# Patient Record
Sex: Female | Born: 1945 | Race: White | Hispanic: No | Smoking: Former smoker
Health system: Southern US, Academic
[De-identification: ages and names within clinical notes are randomized; demographics above are authoritative.]

## PROBLEM LIST (undated history)

## (undated) DIAGNOSIS — H269 Unspecified cataract: Secondary | ICD-10-CM

## (undated) DIAGNOSIS — I4891 Unspecified atrial fibrillation: Secondary | ICD-10-CM

## (undated) DIAGNOSIS — R600 Localized edema: Secondary | ICD-10-CM

## (undated) DIAGNOSIS — R202 Paresthesia of skin: Secondary | ICD-10-CM

## (undated) DIAGNOSIS — R001 Bradycardia, unspecified: Secondary | ICD-10-CM

## (undated) DIAGNOSIS — I2510 Atherosclerotic heart disease of native coronary artery without angina pectoris: Secondary | ICD-10-CM

## (undated) DIAGNOSIS — H9193 Unspecified hearing loss, bilateral: Secondary | ICD-10-CM

## (undated) DIAGNOSIS — I10 Essential (primary) hypertension: Secondary | ICD-10-CM

## (undated) DIAGNOSIS — N289 Disorder of kidney and ureter, unspecified: Secondary | ICD-10-CM

## (undated) DIAGNOSIS — C4492 Squamous cell carcinoma of skin, unspecified: Secondary | ICD-10-CM

## (undated) DIAGNOSIS — M25569 Pain in unspecified knee: Secondary | ICD-10-CM

## (undated) DIAGNOSIS — E785 Hyperlipidemia, unspecified: Secondary | ICD-10-CM

## (undated) DIAGNOSIS — I059 Rheumatic mitral valve disease, unspecified: Secondary | ICD-10-CM

## (undated) DIAGNOSIS — M8580 Other specified disorders of bone density and structure, unspecified site: Secondary | ICD-10-CM

## (undated) DIAGNOSIS — E559 Vitamin D deficiency, unspecified: Secondary | ICD-10-CM

## (undated) DIAGNOSIS — R1900 Intra-abdominal and pelvic swelling, mass and lump, unspecified site: Secondary | ICD-10-CM

## (undated) HISTORY — DX: Unspecified hearing loss, bilateral: H91.93

## (undated) HISTORY — DX: Localized edema: R60.0

## (undated) HISTORY — DX: Squamous cell carcinoma of skin, unspecified: C44.92

## (undated) HISTORY — PX: BREAST SURGERY: SHX581

## (undated) HISTORY — DX: Disorder of kidney and ureter, unspecified: N28.9

## (undated) HISTORY — DX: Intra-abdominal and pelvic swelling, mass and lump, unspecified site: R19.00

## (undated) HISTORY — PX: MOLE REMOVAL: SHX2046

## (undated) HISTORY — DX: Unspecified atrial fibrillation (CMS HCC): I48.91

## (undated) HISTORY — DX: Other specified disorders of bone density and structure, unspecified site: M85.80

## (undated) HISTORY — DX: Bradycardia, unspecified: R00.1

## (undated) HISTORY — DX: Essential (primary) hypertension: I10

## (undated) HISTORY — DX: Vitamin D deficiency, unspecified: E55.9

## (undated) HISTORY — PX: HX HYSTERECTOMY: SHX81

## (undated) HISTORY — PX: COLONOSCOPY: WVUENDOPRO10

## (undated) HISTORY — DX: Atherosclerotic heart disease of native coronary artery without angina pectoris: I25.10

## (undated) HISTORY — PX: HX BREAST BIOPSY: SHX20

## (undated) HISTORY — DX: Unspecified cataract: H26.9

## (undated) HISTORY — DX: Hyperlipidemia, unspecified: E78.5

## (undated) HISTORY — DX: Paresthesia of skin: R20.2

## (undated) HISTORY — DX: Rheumatic mitral valve disease, unspecified: I05.9

## (undated) HISTORY — DX: Pain in unspecified knee: M25.569

---

## 1992-04-27 ENCOUNTER — Other Ambulatory Visit (HOSPITAL_COMMUNITY): Payer: Self-pay

## 2008-03-02 IMAGING — US CV
1 series · 14 of 16 positions shown · non-contrast
Comparison: None available.

HISTORY: This 62-year-old female complains of syncopal episodes and facial numbness on the left side.  The patient is an ex-smoker.

[Series 1: cv · 0.06mm/px · 14 of 53 slices shown]
[im 1/53]
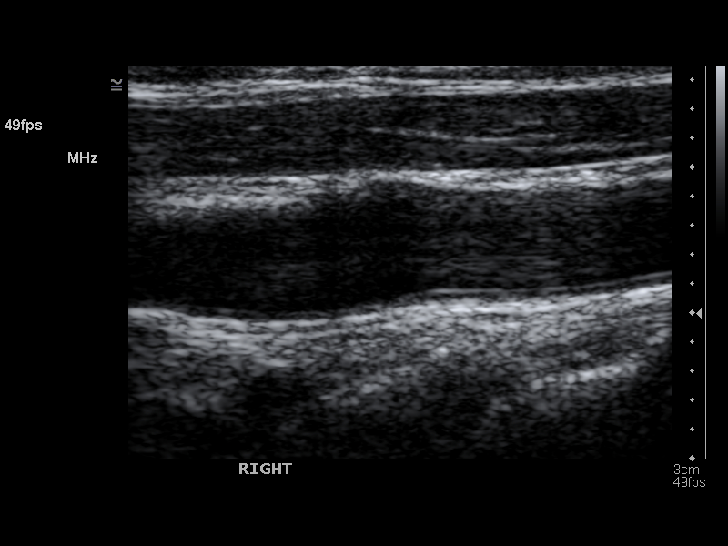
[im 4/53]
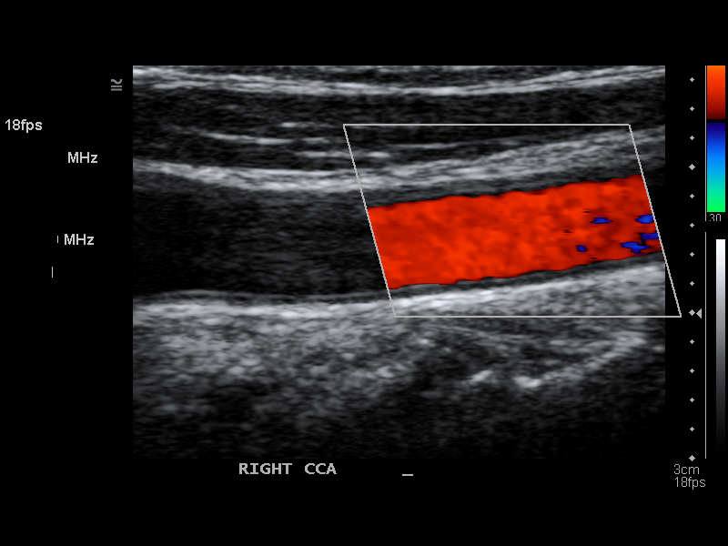
[im 7/53]
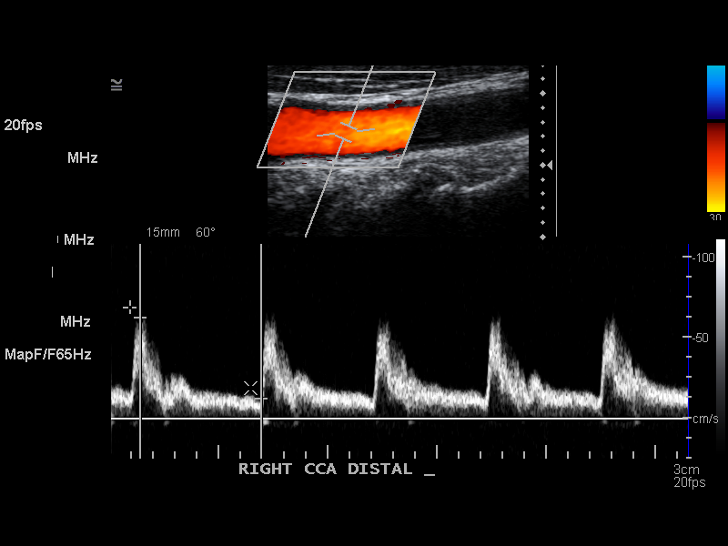
[im 14/53]
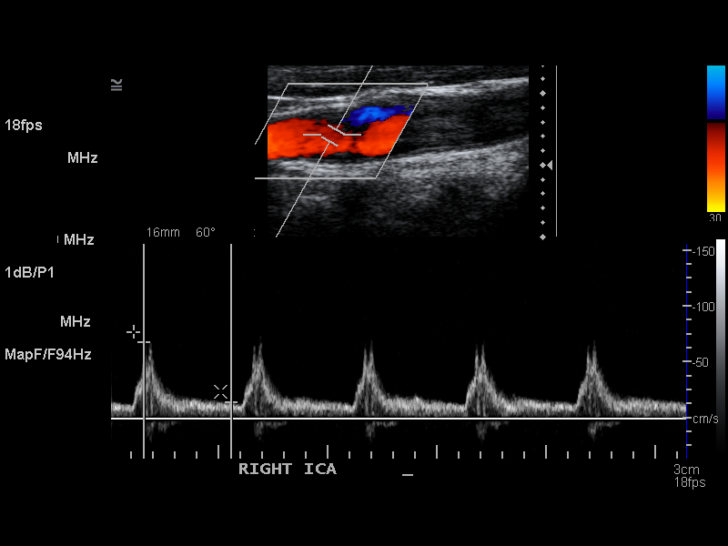
[im 18/53]
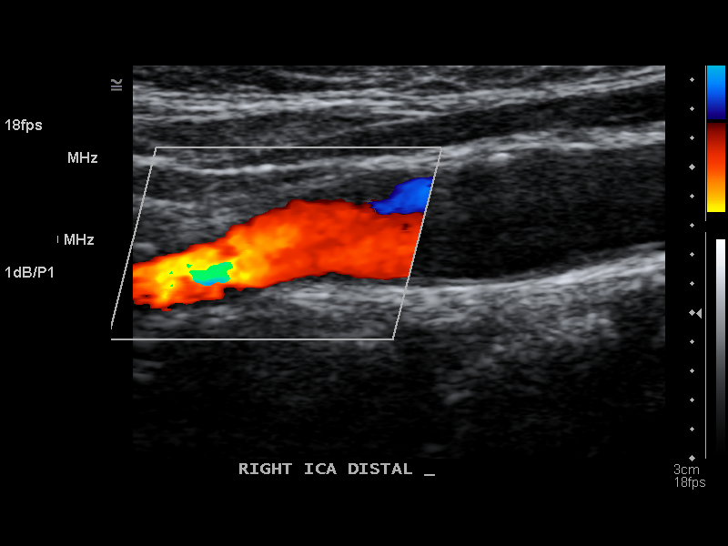
[im 21/53]
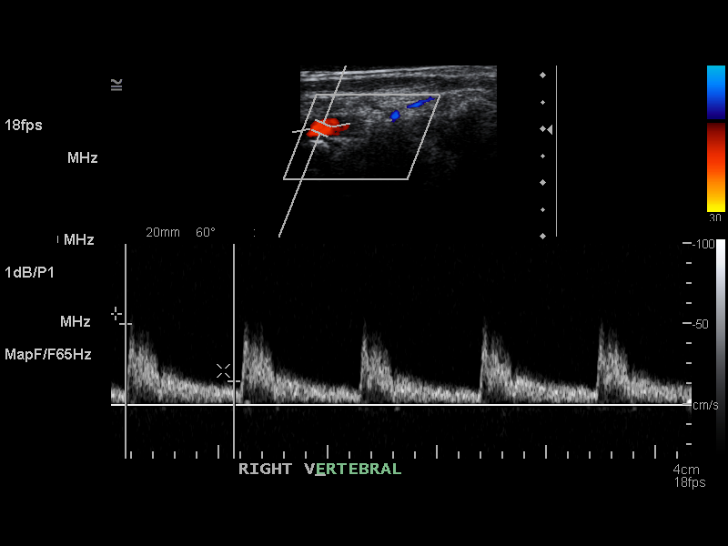
[im 25/53]
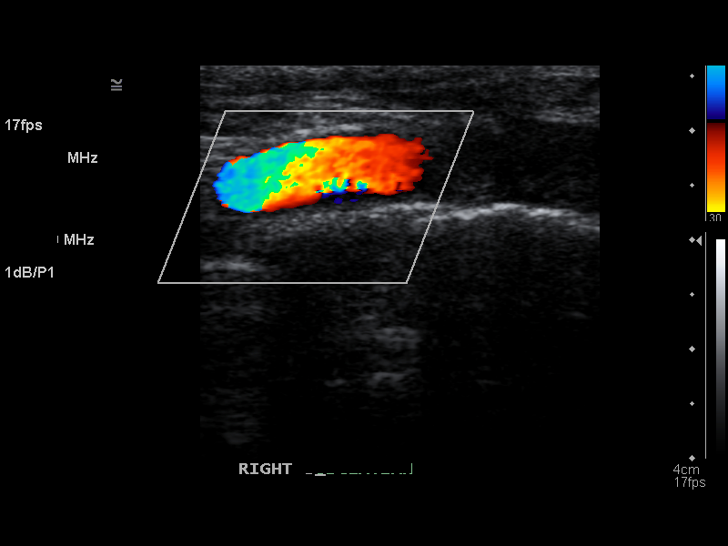
[im 28/53]
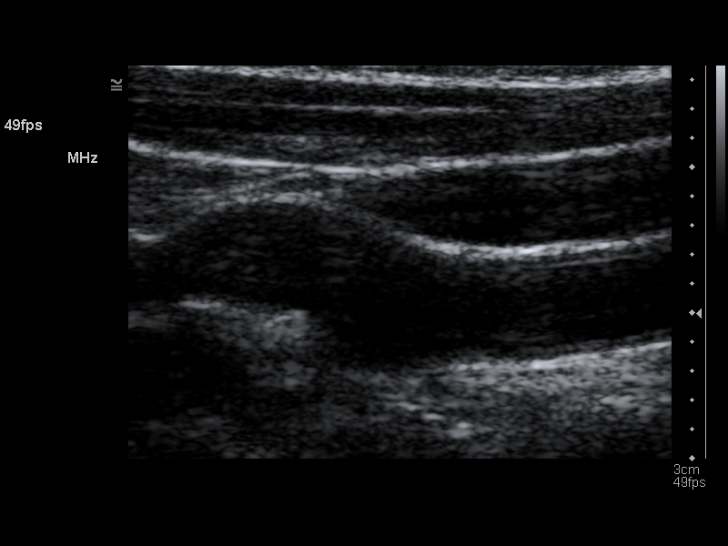
[im 32/53]
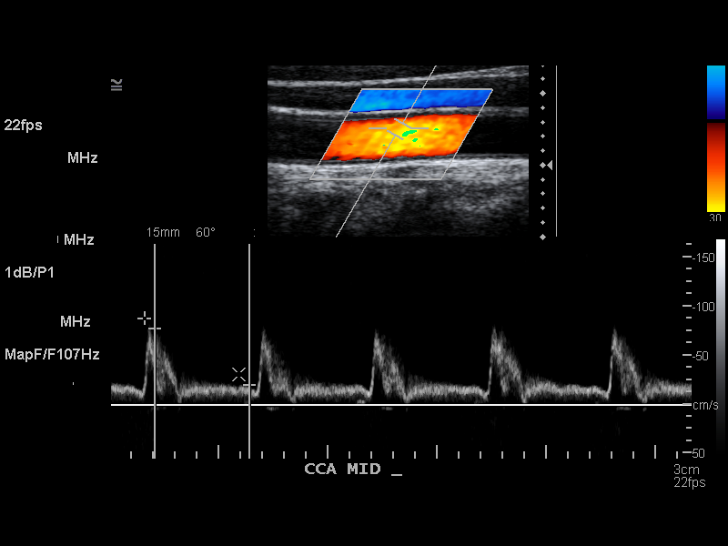
[im 35/53]
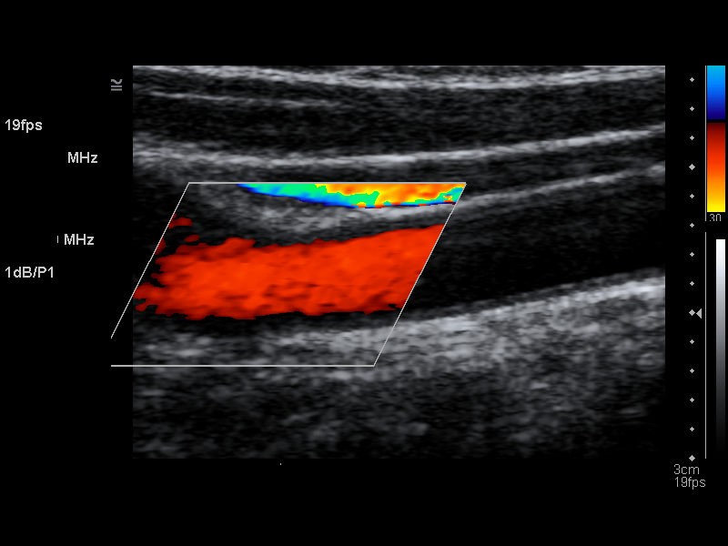
[im 42/53]
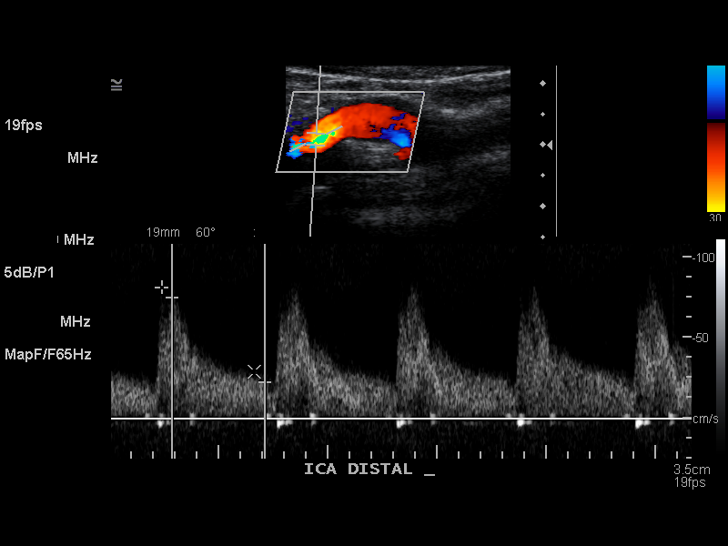
[im 46/53]
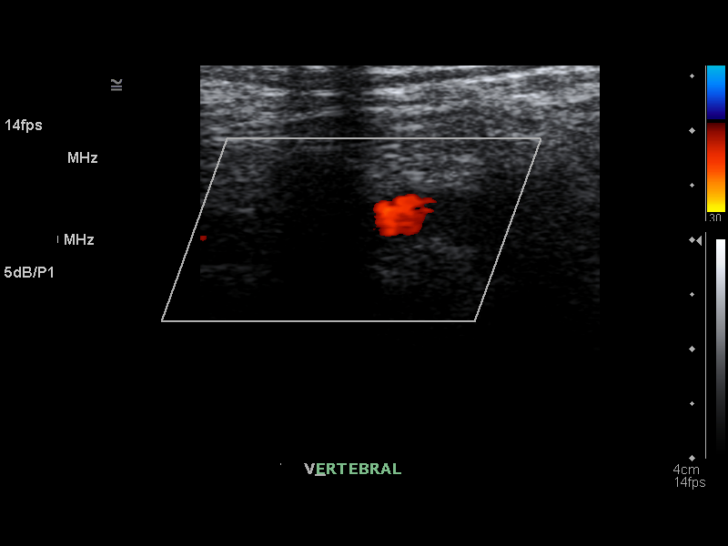
[im 49/53]
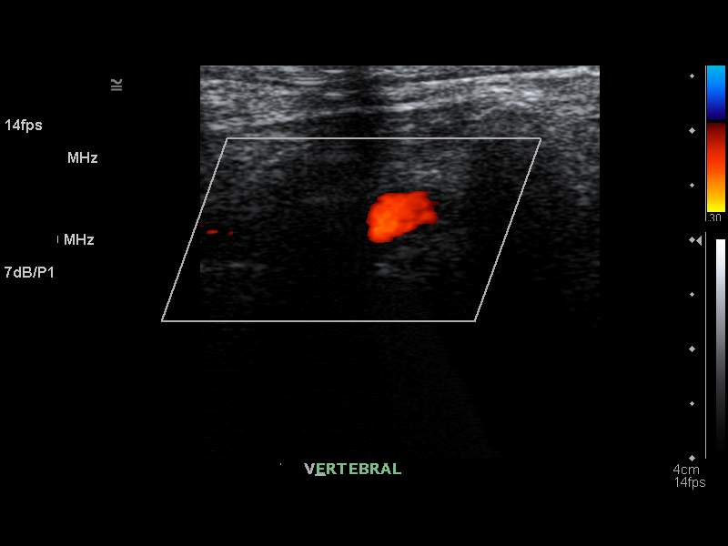
[im 53/53]
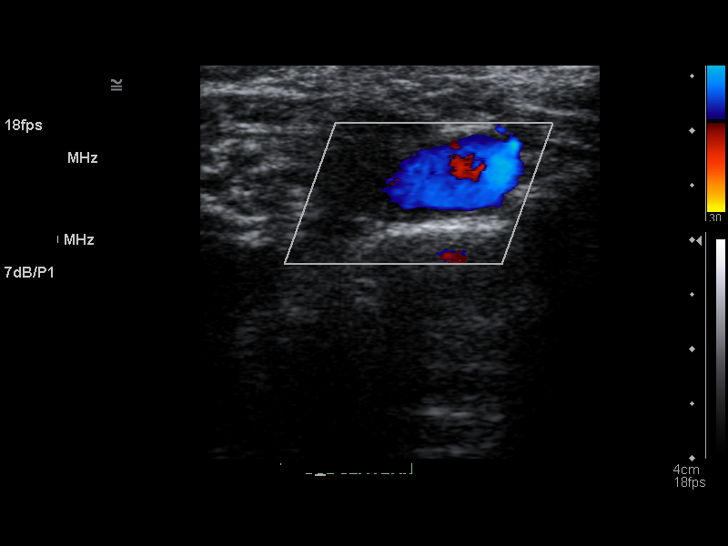

[14 of 16 positions shown; findings below may reference images not displayed]

FINDINGS: Antegrade flow is noted in both vertebral arteries.  No hemodynamically significant obstruction of the carotid arteries are seen on either side.
IMPRESSION: 1.  Normal duplex Doppler flow studies of vertebral and carotid arteries of the neck.  No evidence of hemodynamically significant obstruction is seen on either side.  

________________________________

## 2010-08-13 IMAGING — US ABDOMEN
1 series · 14 of 25 positions shown · non-contrast
Comparison: CT scan dated 02/08/10.

Bender, Carlos Afonso

Exam: 
Ultrasound Upper Abdomen Complete
HISTORY: Follow up hemangioma of the liver.

[Series 1: abdomen · 0.29mm/px · 14 of 63 slices shown]
[im 1/63]
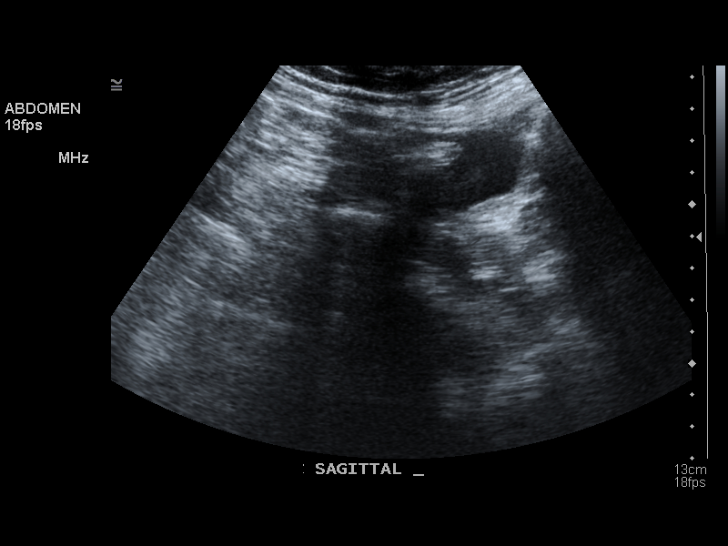
[im 6/63]
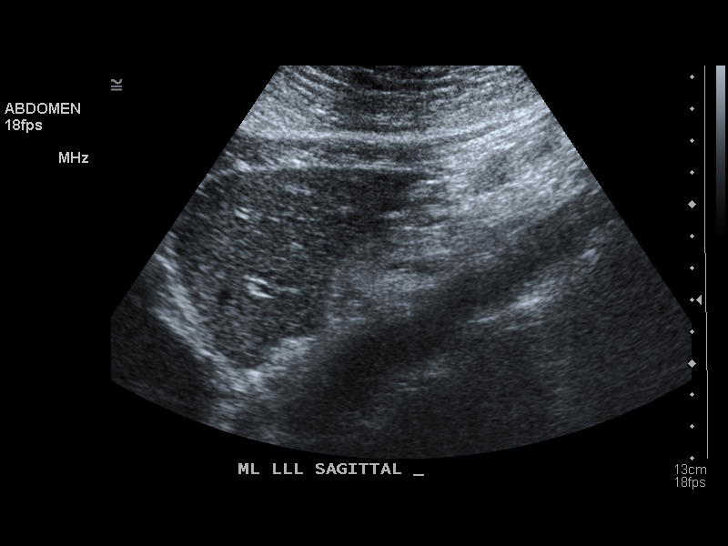
[im 11/63]
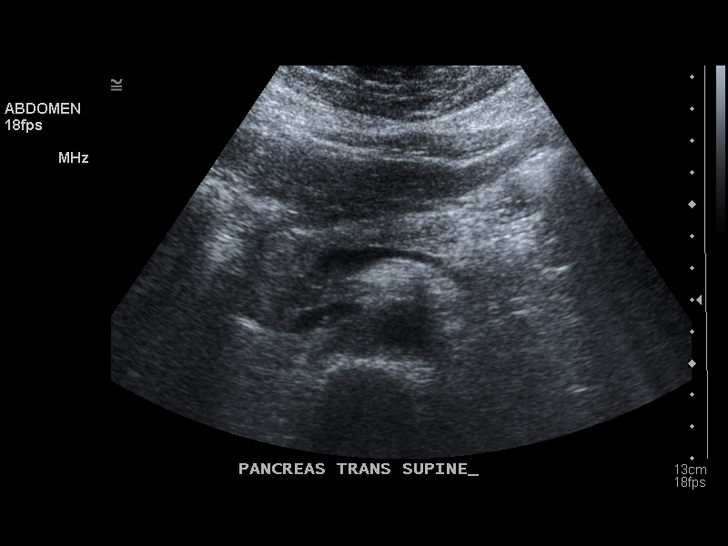
[im 16/63]
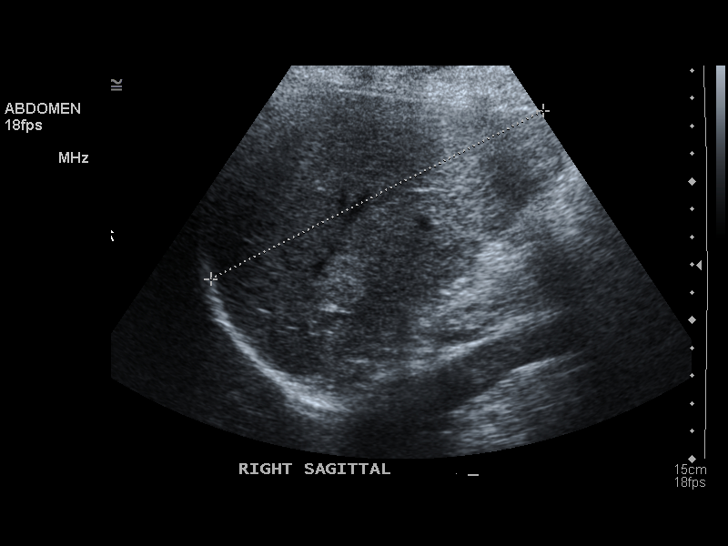
[im 21/63]
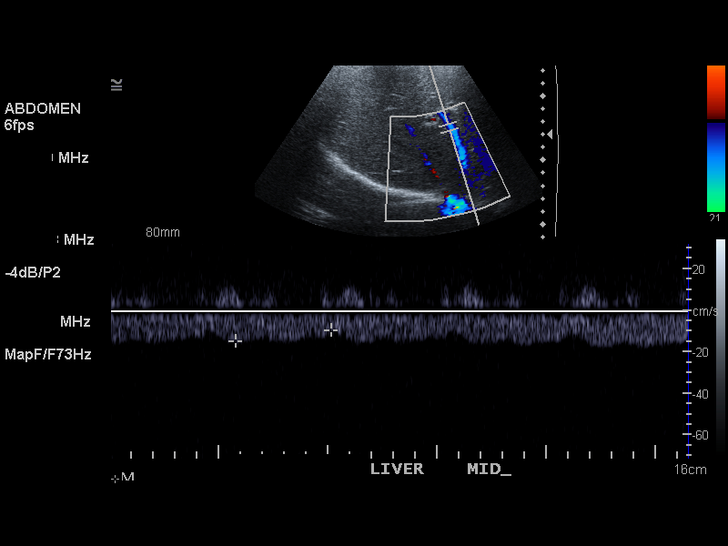
[im 24/63]
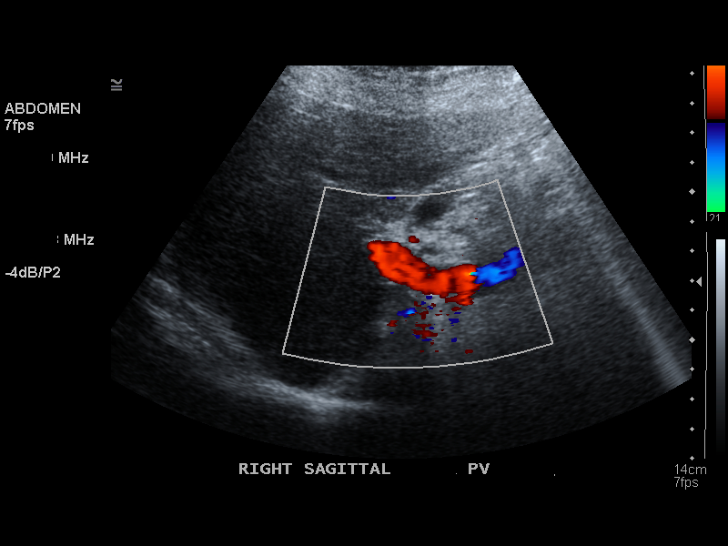
[im 29/63]
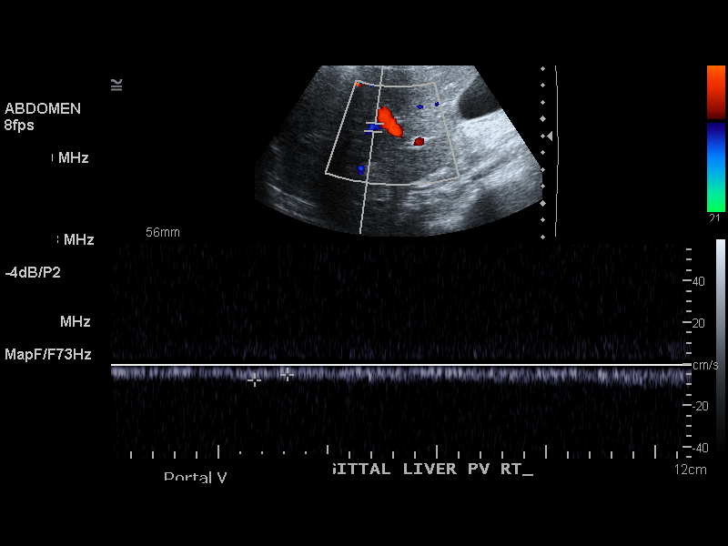
[im 34/63]
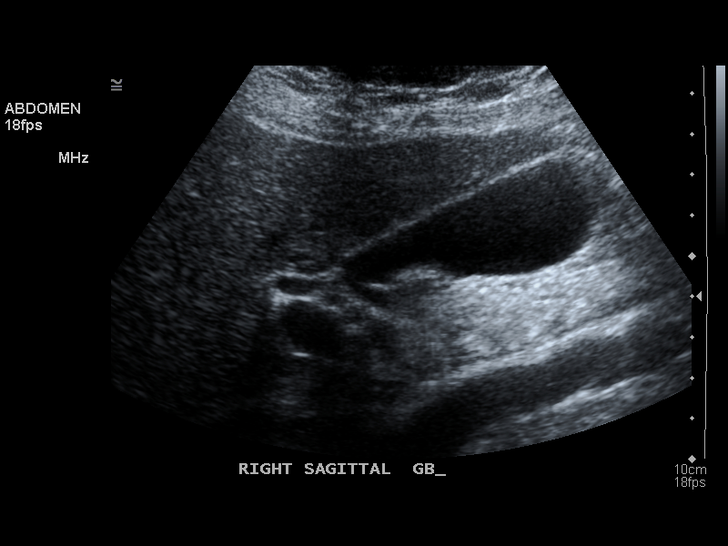
[im 39/63]
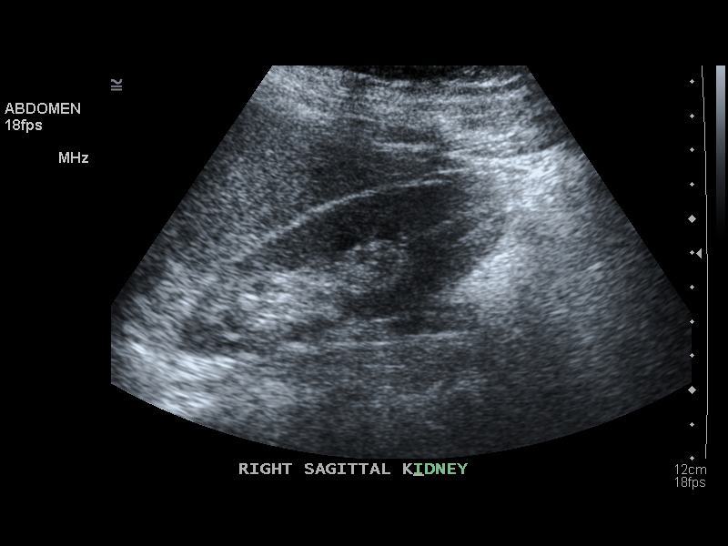
[im 42/63]
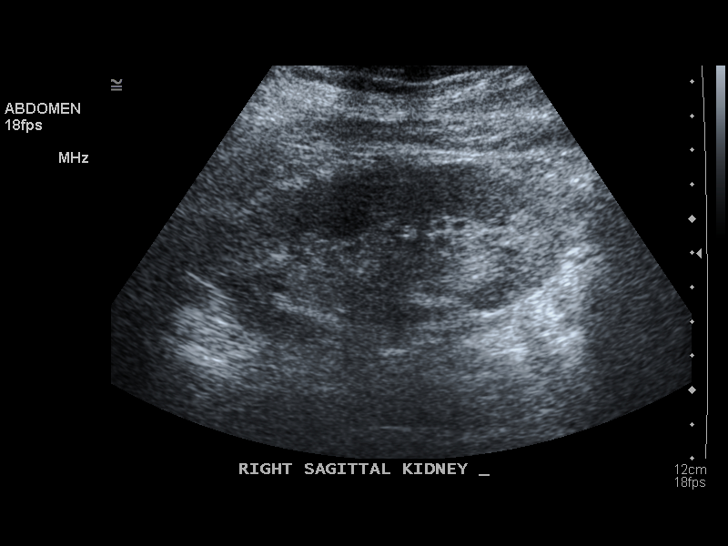
[im 47/63]
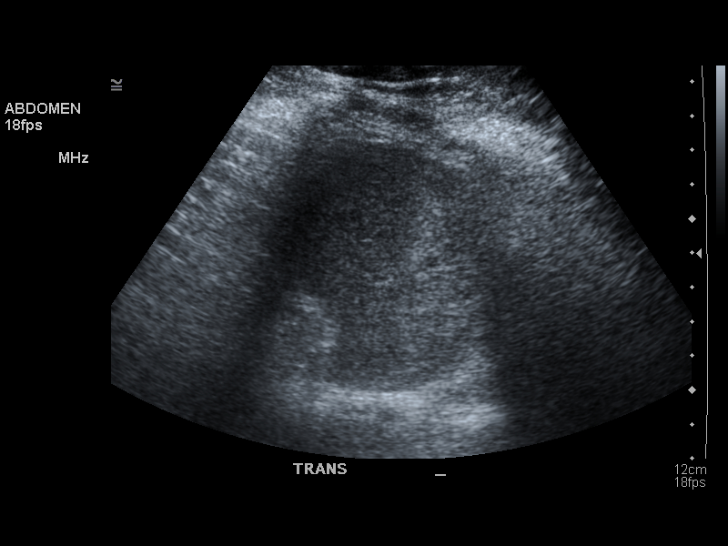
[im 52/63]
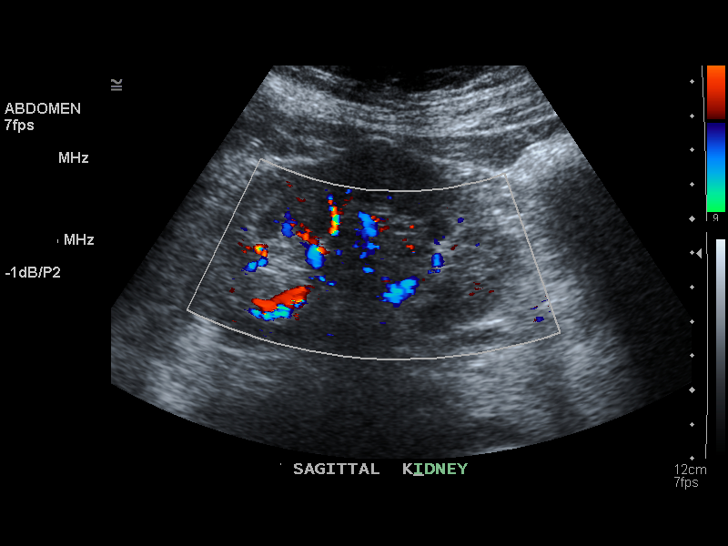
[im 57/63]
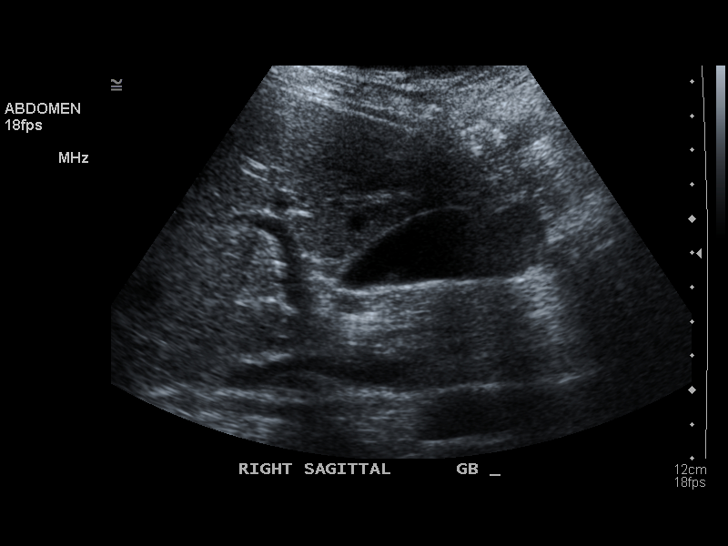
[im 63/63]
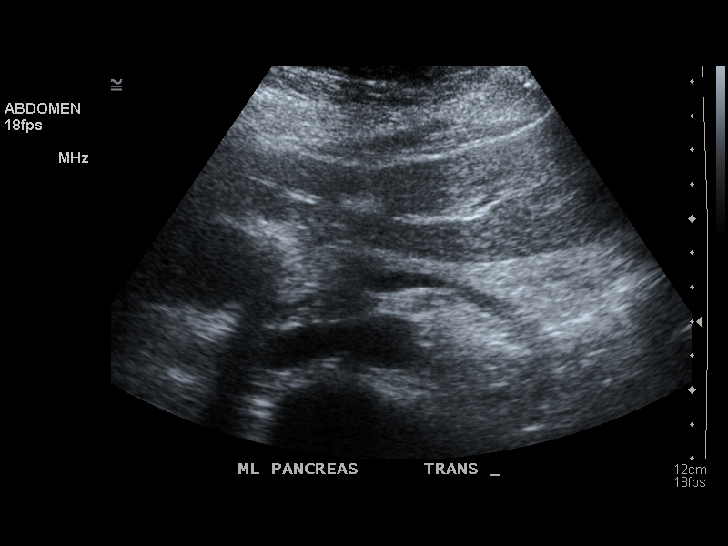

[14 of 25 positions shown; findings below may reference images not displayed]

FINDINGS: The gallbladder shows no focal abnormalities. Hyperechoic lesion of the right lobe of the liver segment #7 is noted consistent with the findings on the CT scan. The lesion is stable in size measuring a diameter of 2 cm. Bile ducts are normal in size. Pancreas, adrenals and the kidneys are normal. Spleen is normal in appearance. Aorta shows mild atherosclerotic changes.
IMPRESSION: Stable small echogenic hemangioma of the right lobe of the liver, segment #7, 2 cm in size compared with the CT scan of 02/08/10. 
Gallbladder and bile ducts are normal. 
Mild atherosclerotic changes of the aorta. No free fluid is seen in the peritoneal cavity. 
One more follow up evaluation by ultrasound in twelve months is suggested. 

________________________________

Ace Pastrana., signed this document electronically.

## 2011-01-16 IMAGING — MG MAMMO SCREEN W CAD
1 series · 5 of 5 positions shown · non-contrast
Comparison: 10/02/09.

Locklear, Savio
INDICATION: Screening
ADDITIONAL HISTORY:
Patient reports bilateral breast biopsies and bilateral breast aspirations. She denies any current problems with her breasts. 

RISK FACTOR:
No positive family history of breast cancer.
TECHNIQUE: Bilateral MLO and CC digital images were obtained. This study was reviewed using iCAD 200 software.

[Series 2: R CC · right · 5 of 5 slices shown]
[im 1/5]
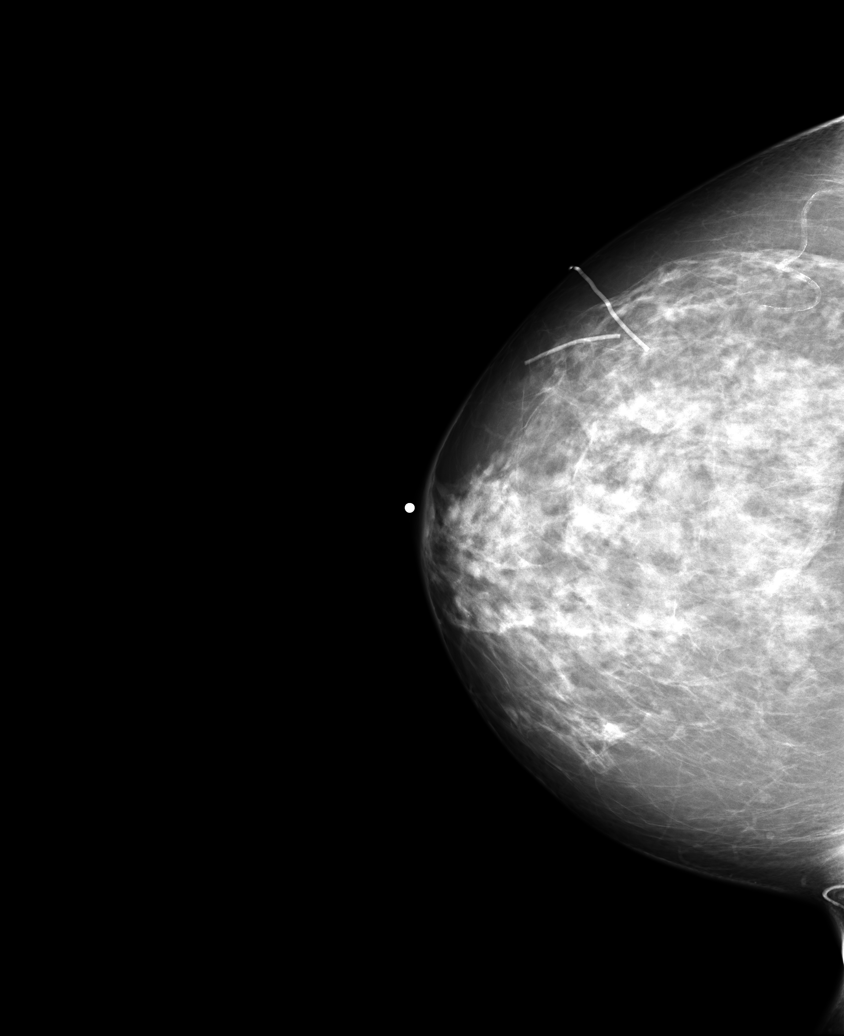
[im 2/5]
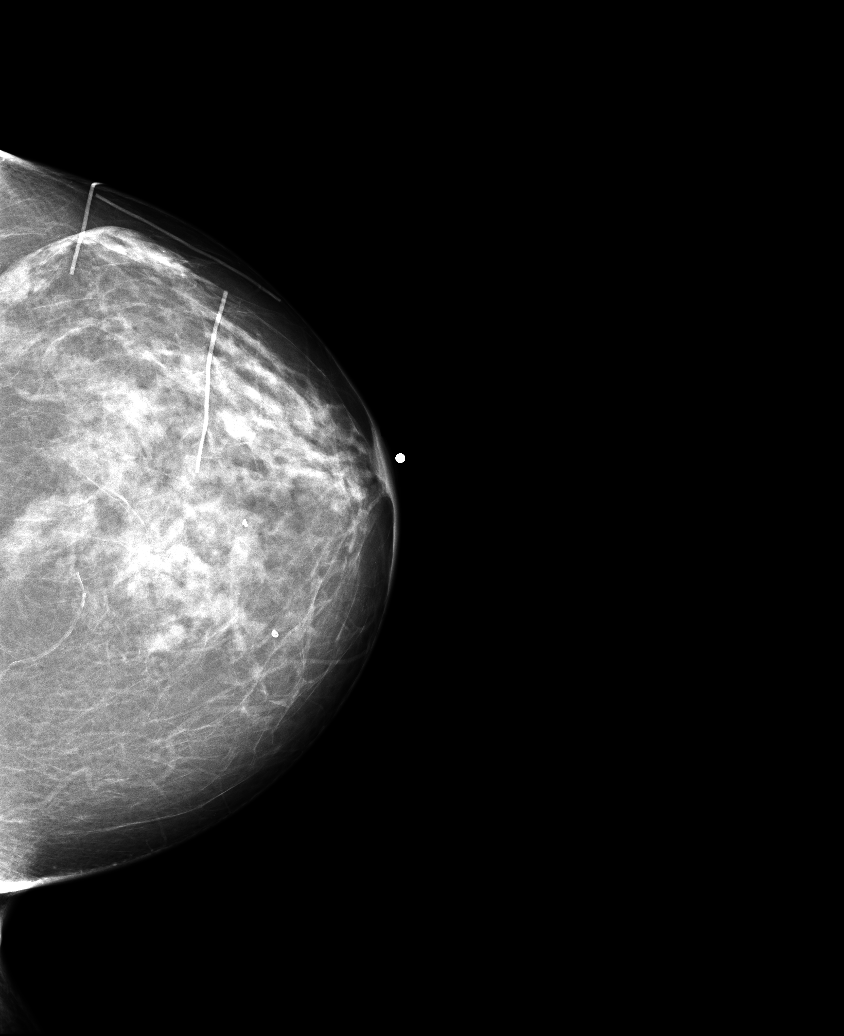
[im 3/5]
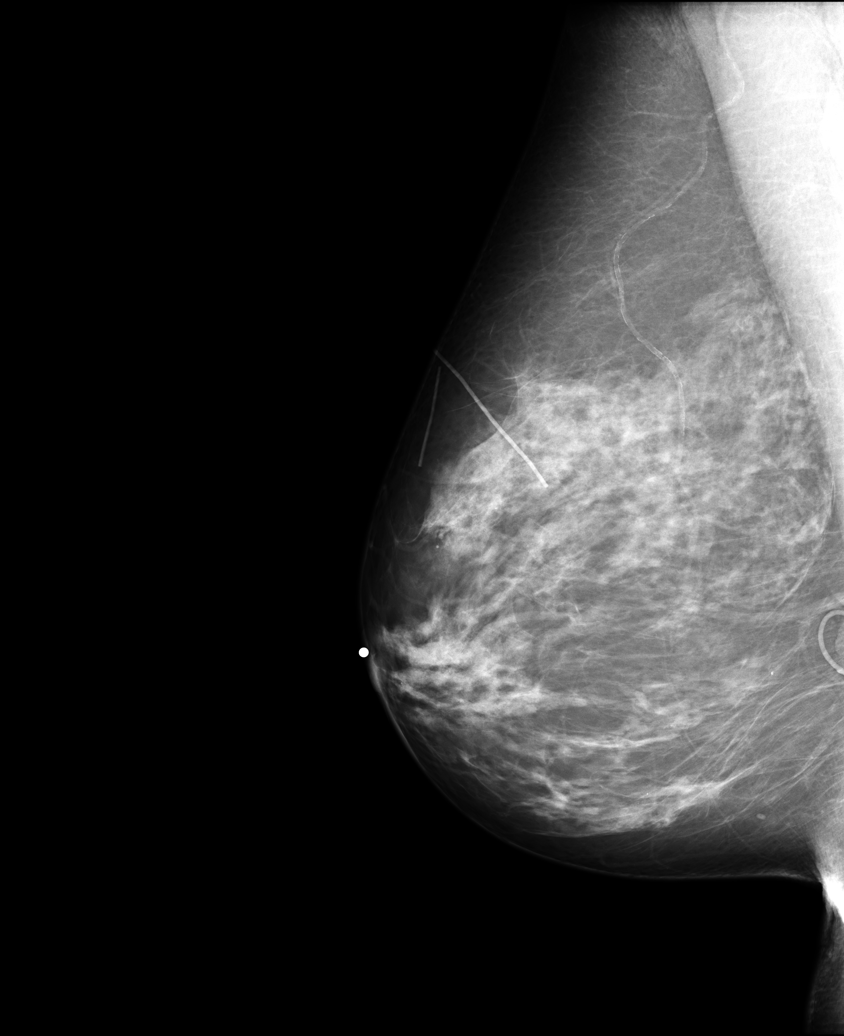
[im 4/5]
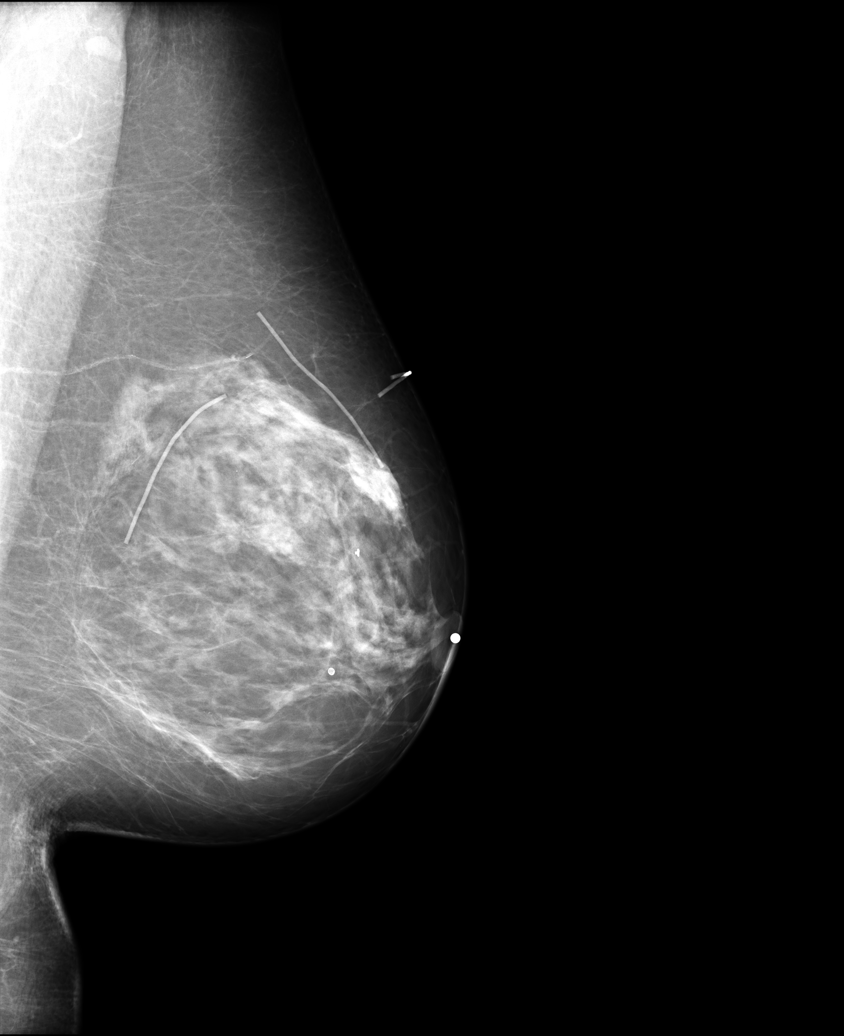
[im 5/5]
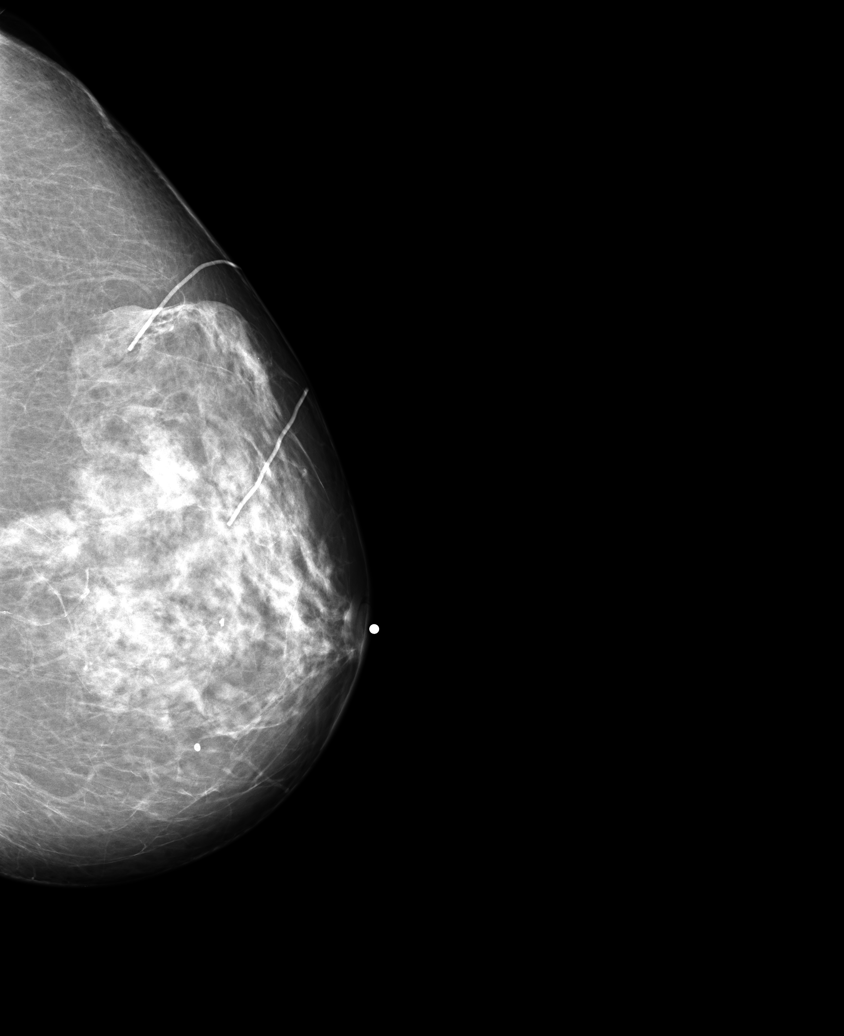

[5 of 5 positions shown; findings below may reference images not displayed]

In addition, there is a left breast diagnostic dated 12/19/09 available for comparison.
FINDINGS: There is an area of architectural distortion which is not appreciated on the previous exams. It is seen in the right CC view in the medial aspect. It is not clearly seen on the MLO view but appears to be more likely in the superior aspect of the breast. Note is made of scattered fibroglandular tissue throughout both breasts. Multiple scar markers from previous surgical intervention are noted. A stereotactic clip is seen in the left breast.
IMPRESSION: BI-RADS 0

RECOMMENDATION: Right breast spot compression with magnification in the CC and MLO view as well as right breast ultrasound in the medial aspect.  

FINAL ASSESSMENT CODE:
BI-RADS 0

________________________________

## 2011-01-30 IMAGING — MG MAMMO UNI RT DIAGNOSTIC W CAD
1 series · 3 of 3 positions shown · non-contrast
Comparison: none

Dig Mammo CB add views, Right Breast Ultrasound

Sintes, Jianmin
Exam:
Ultrasound Breast Right and Digital Diagnostic Mammogram Right Breast Unilateral
INDICATION: Abnormal screening mammogram.
TECHNIQUE: Right CC and MLO digital images were performed with magnification and spot compression. In addition, right breast ultrasound in the medial aspect.

[Series 2: R CC · right · 3 of 3 slices shown]
[im 1/3]
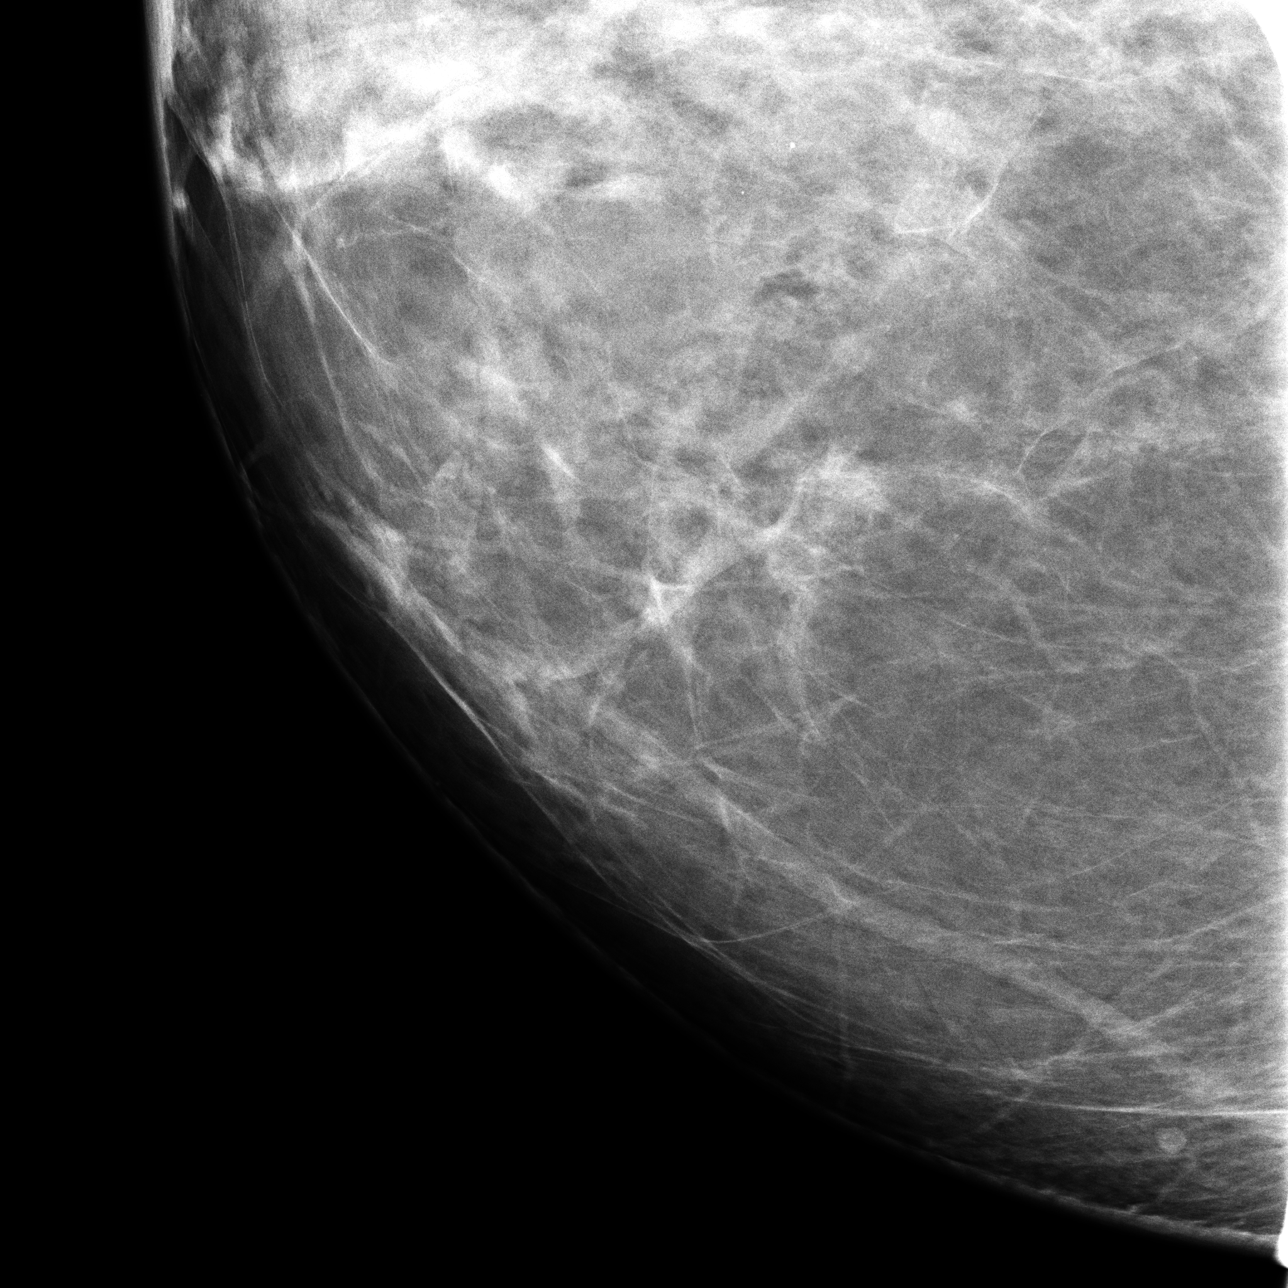
[im 2/3]
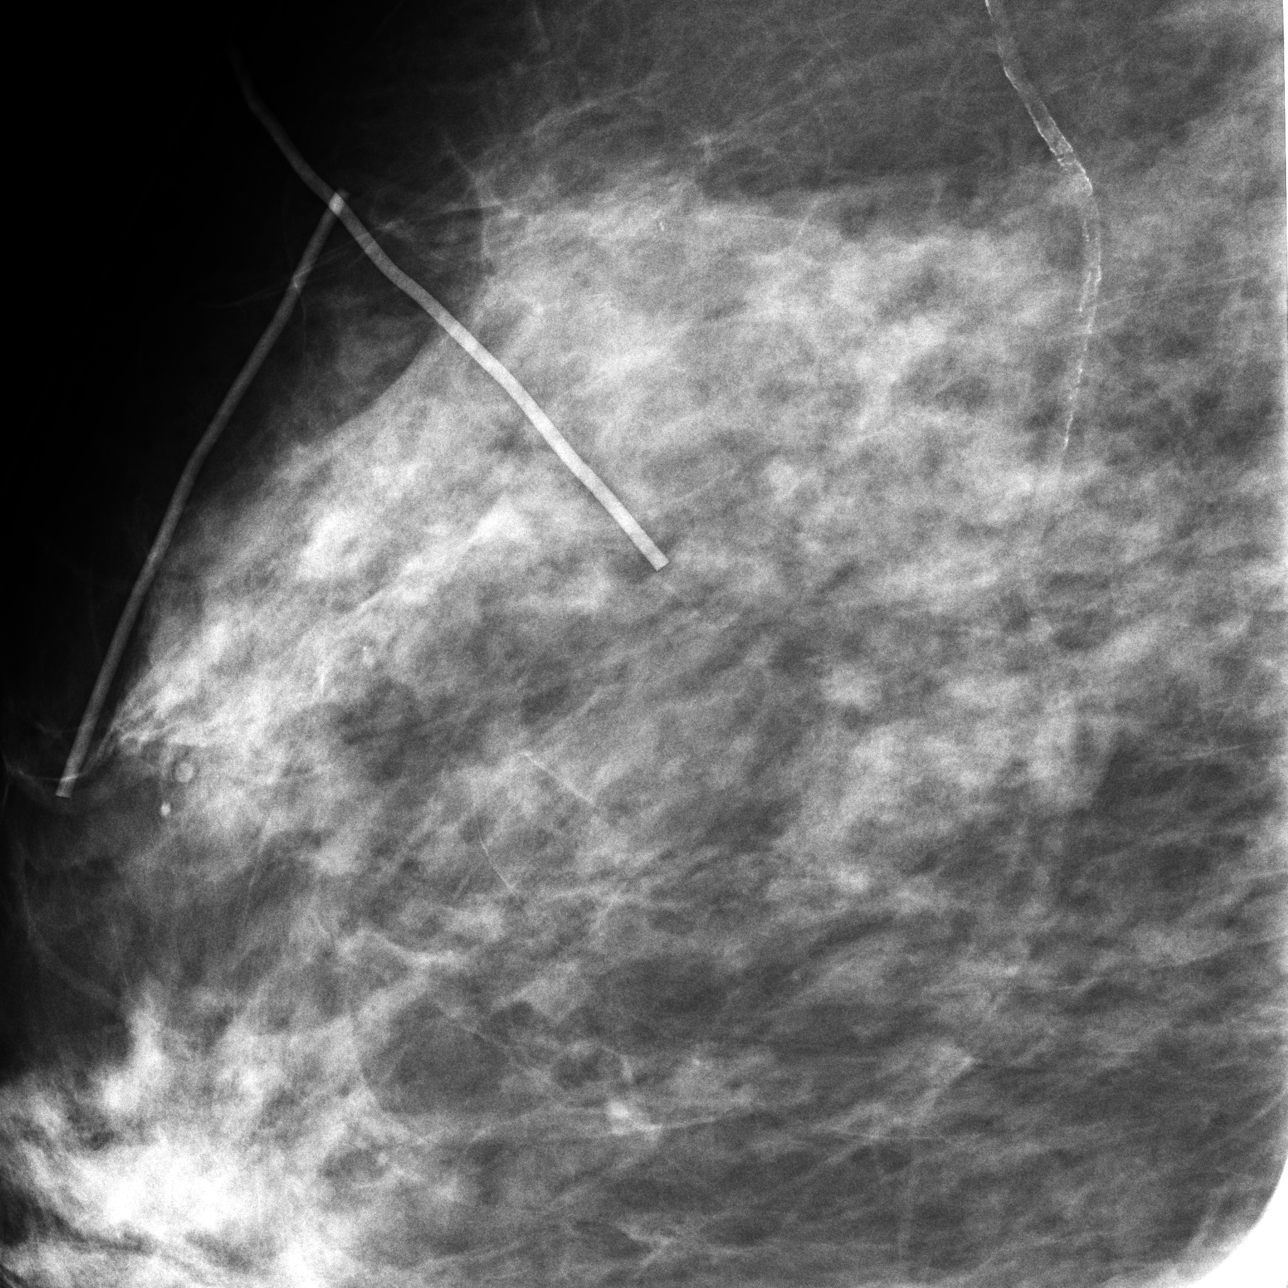
[im 3/3]
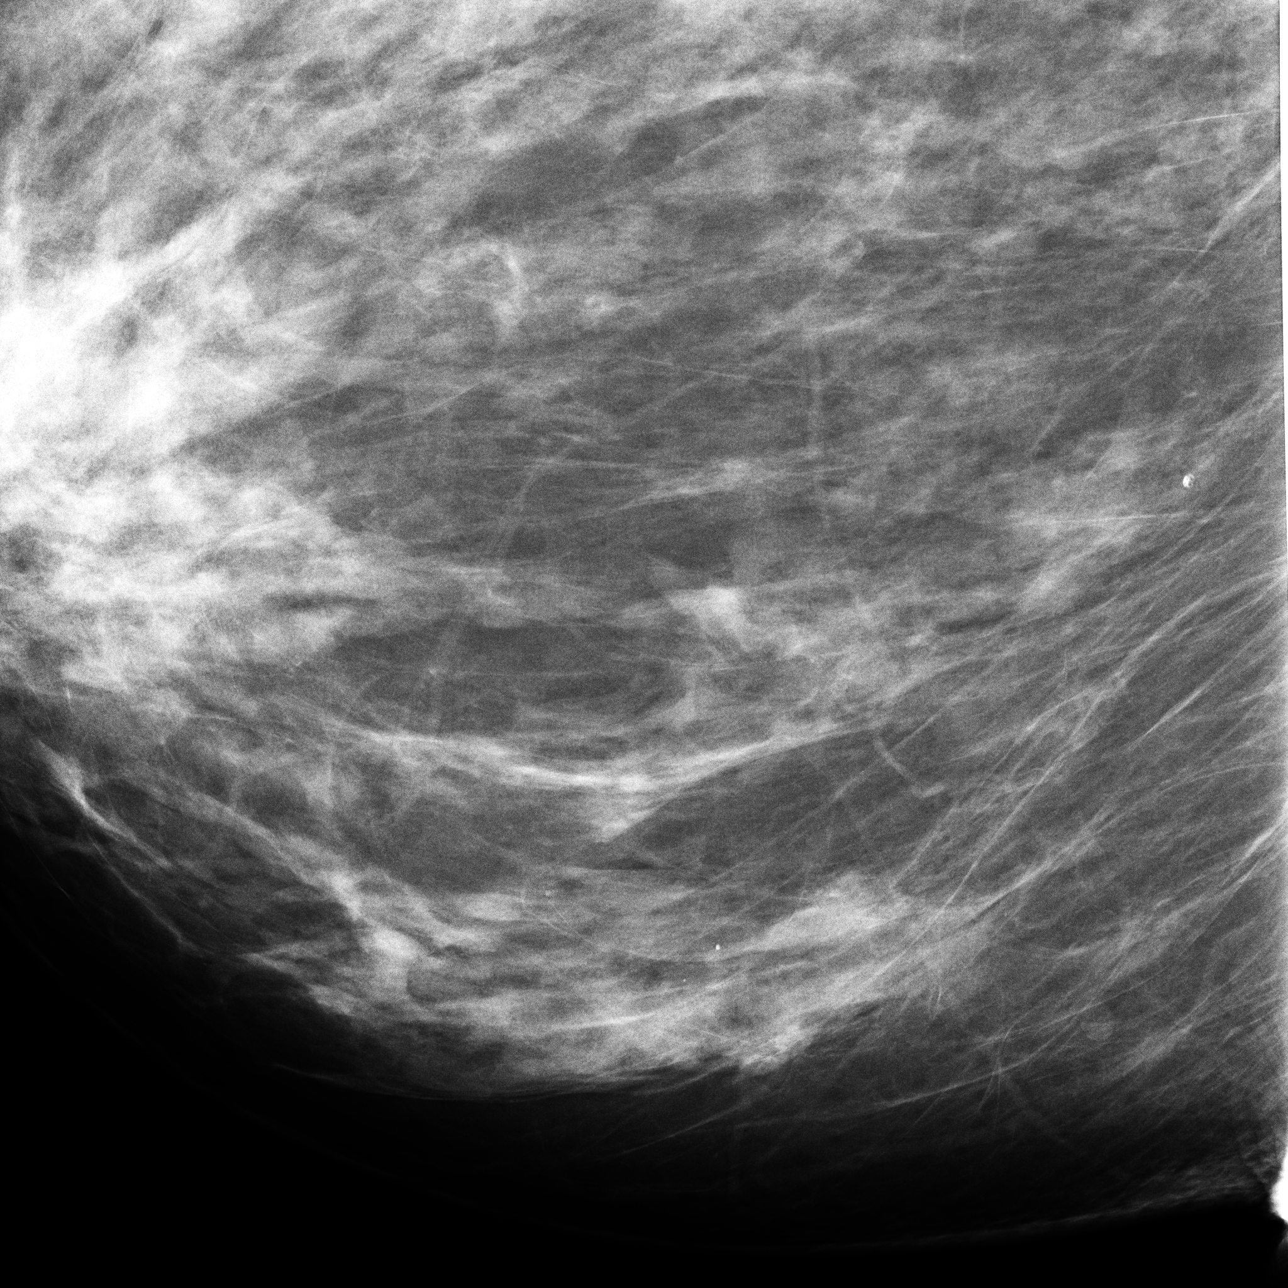

[3 of 3 positions shown; findings below may reference images not displayed]

FINDINGS: Imaging demonstrates effacement of the findings on the mammogram. Ultrasound imaging demonstrates at the 3 o’clock position in the right breast a smoothly marginated 5 x 2 mm hypo echoic mass. It is wider than tall with no shadowing. No other lesions or abnormalities are identified. Dense fibroglandular tissue is noted to be present in this region. 

NOTE:

In compliance with Federal regulations, the results of this mammogram are being sent to the patient.
IMPRESSION: Bi-Rads 2. 
RECOMMENDATION: Annual screening per ACR guidelines and monthly self breast examination. 
Final Assessment Code:
Bi-Rads 2 

BI-RADS 0
Need additional imaging evaluation
BI-RADS 1
Negative mammogram
BI-RADS 2
Benign finding
BI-RADS 3
Probably benign finding: short-interval follow-up suggested
BI-RADS 4
Suspicious abnormality:  biopsy should be considered
BI-RADS 5
Highly suggestive of malignancy; appropriate action should be taken

________________________________

## 2012-01-20 IMAGING — MG MAMMO SCREEN W CAD
1 series · 4 of 4 positions shown · non-contrast
Comparison: Mammography from January 2011.

Lafferty, Irineo

Exam:
Digital screening mammogram with CAD
INDICATION: Annual.

[Series 2: R CC · right · 4 of 4 slices shown]
[im 1/4]
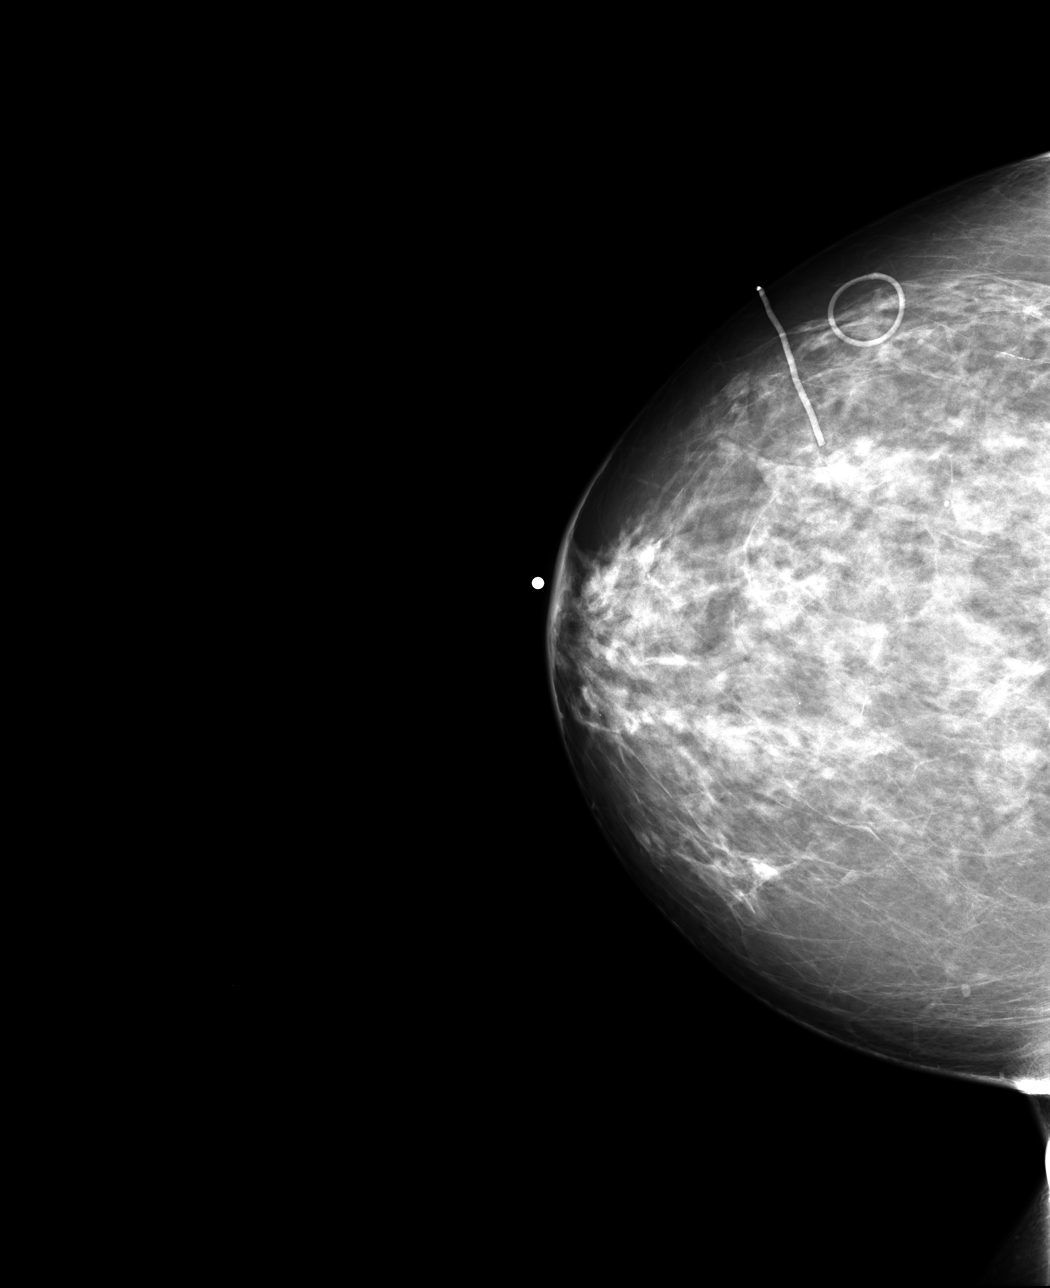
[im 2/4]
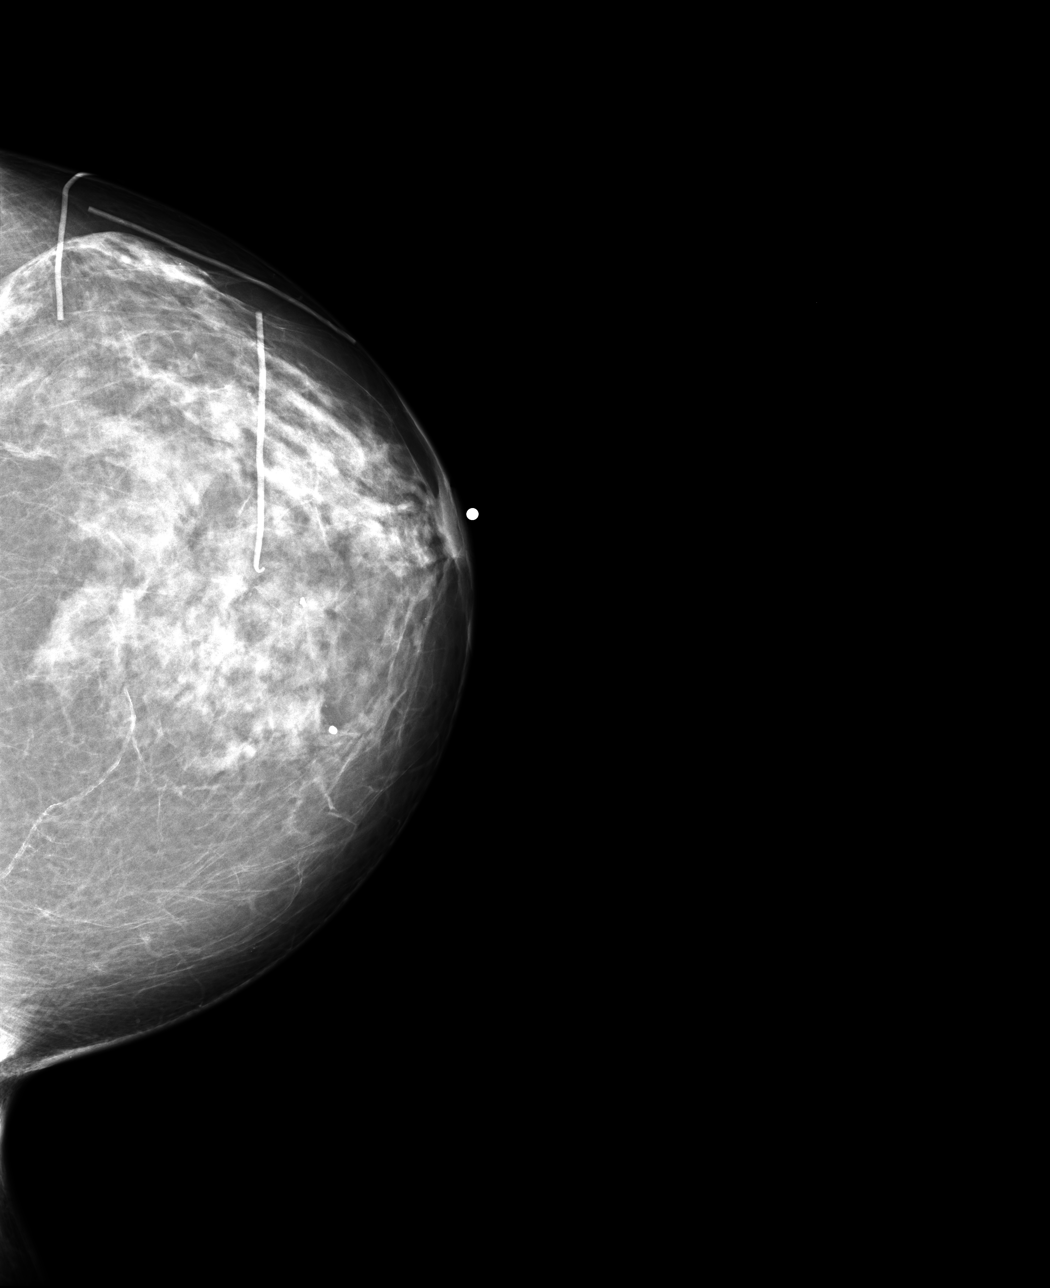
[im 3/4]
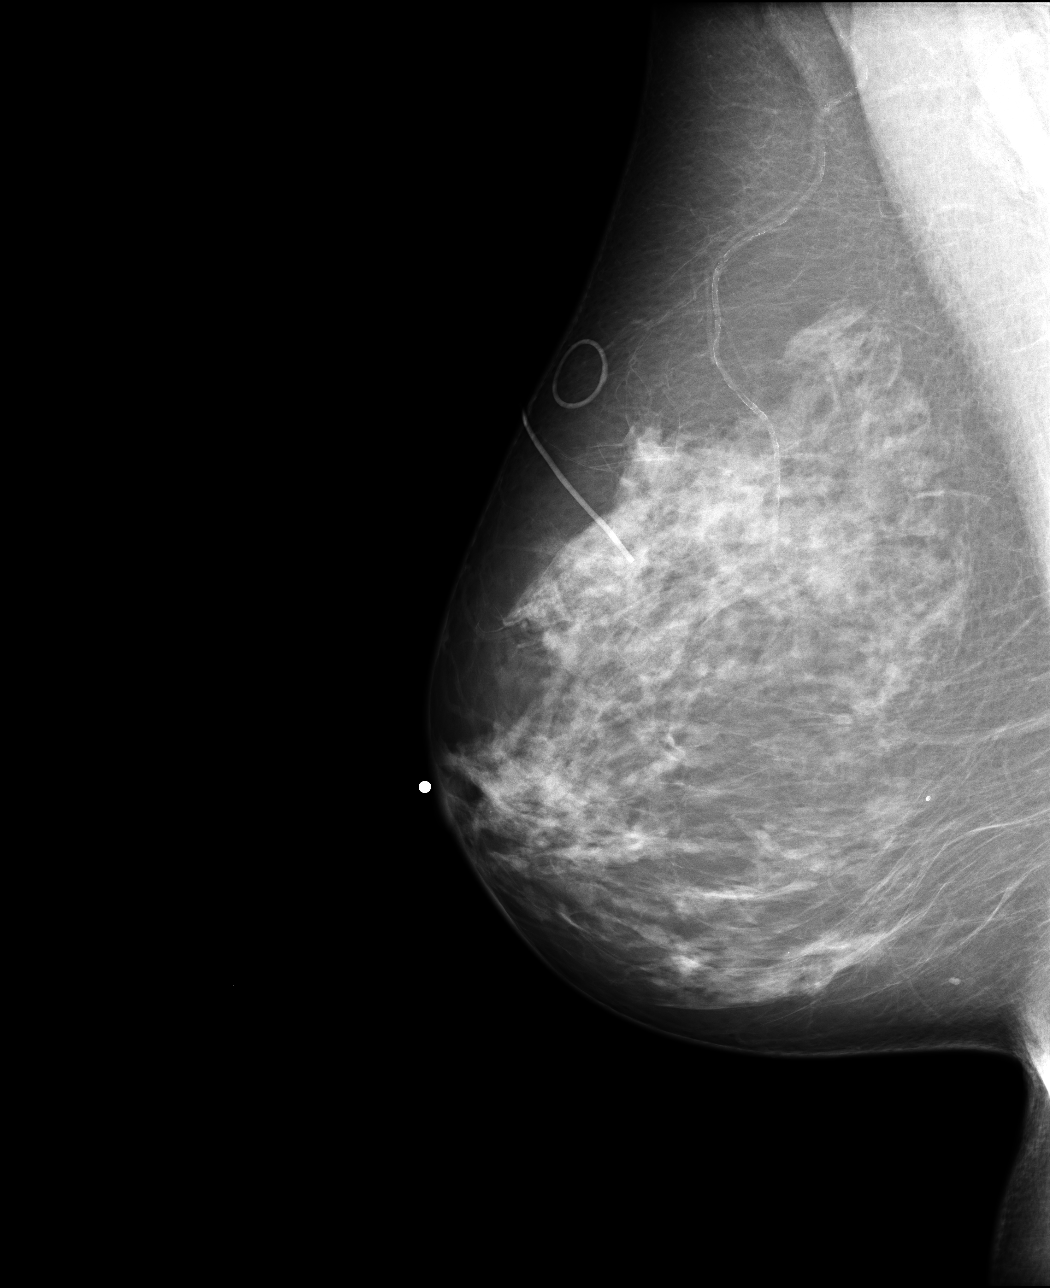
[im 4/4]
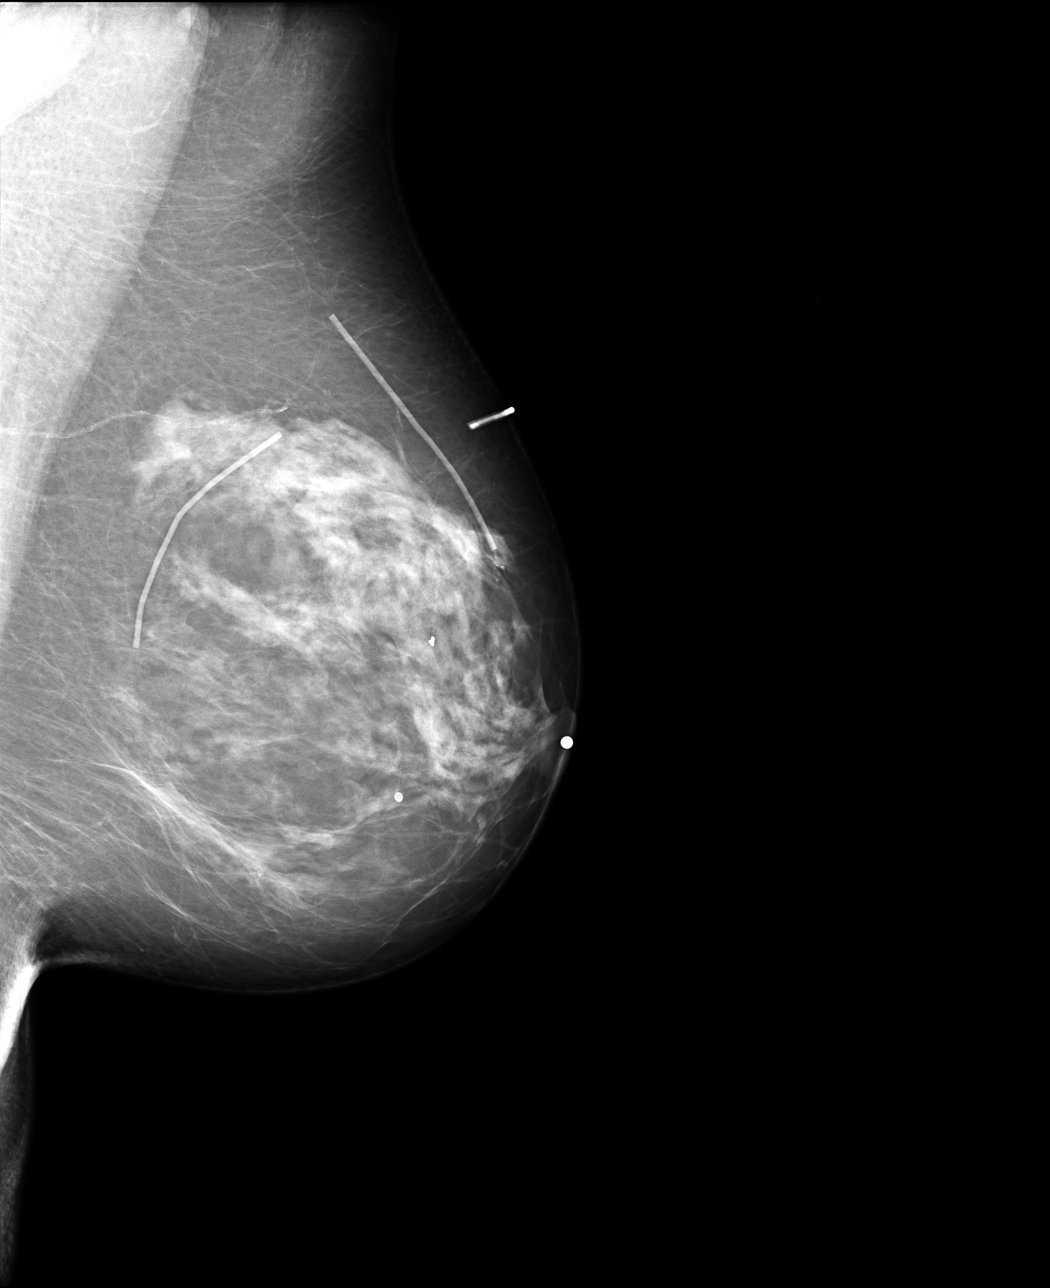

[4 of 4 positions shown; findings below may reference images not displayed]

FINDINGS: Breasts contain scattered fibroglandular elements. Stable asymmetries seen in the right breast inferiorly. Stable bilateral surgical changes noted. 

No new mass or asymmetry noted. No new skin thickening or nipple retraction seen. 

NOTE:

In compliance with Federal regulations, the results of this mammogram are being sent to the patient.
IMPRESSION: Bi-Rads 2-Benign findings. 
RECOMMENDATION: Resume annual screening. 
Final Assessment Code:
Bi-Rads 2 

BI-RADS 0
Need additional imaging evaluation
BI-RADS 1
Negative mammogram
BI-RADS 2
Benign finding
BI-RADS 3
Probably benign finding: short-interval follow-up suggested
BI-RADS 4
Suspicious abnormality:  biopsy should be considered
BI-RADS 5
Highly suggestive of malignancy; appropriate action should be taken

________________________________
Real, Deontae., signed this document electronically

## 2013-01-21 IMAGING — MG MAMMO SCREEN W CAD
1 series · 5 of 5 positions shown · non-contrast
Comparison: 01/20/12 and 01/16/11.

Tiger, Zeinab

Exam:
Bilateral digital screening mammogram with CAD
HISTORY: Asymptomatic 67 year old with no family history of breast cancer in first degree relatives. Patient has personal history of skin cancer.

[Series 2: R CC · oblique · right · 5 of 5 slices shown]
[im 1/5]
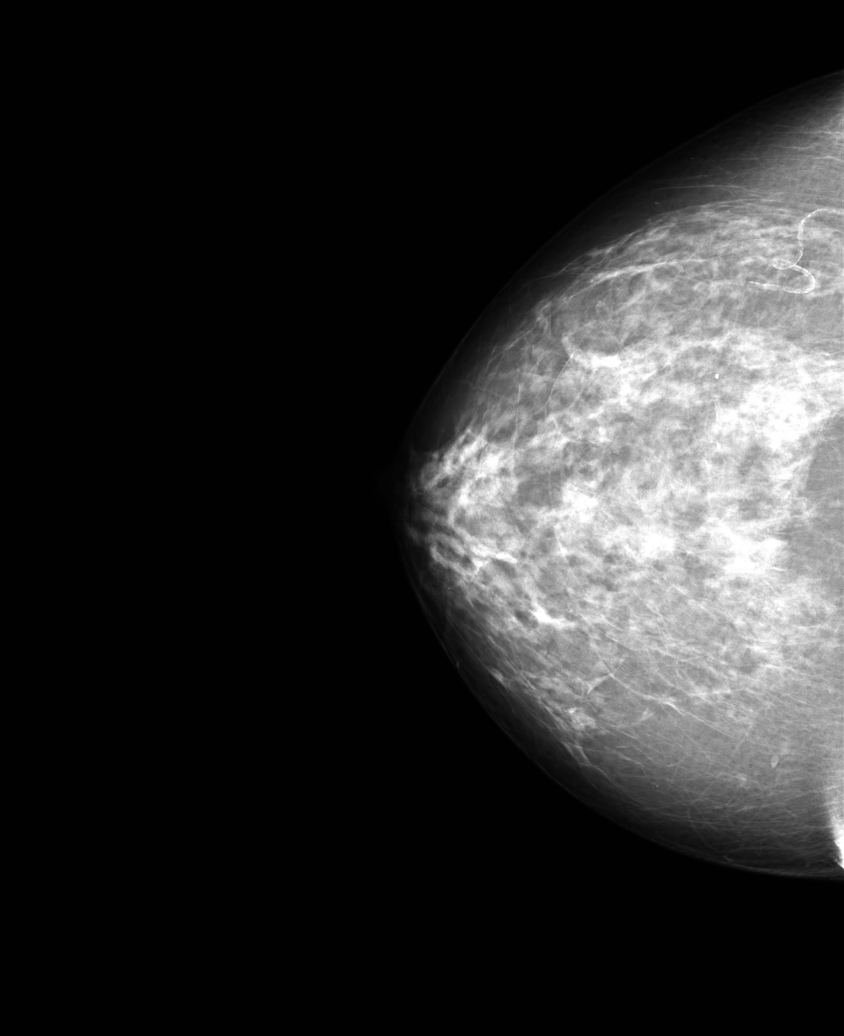
[im 2/5]
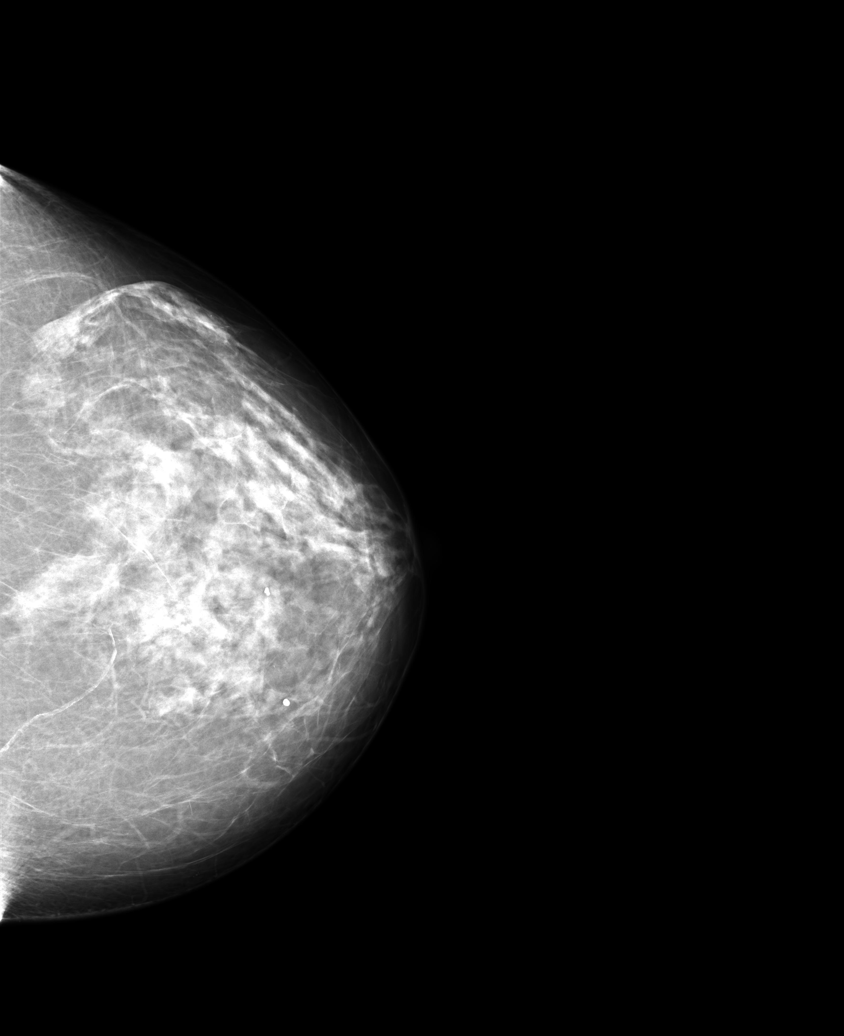
[im 3/5]
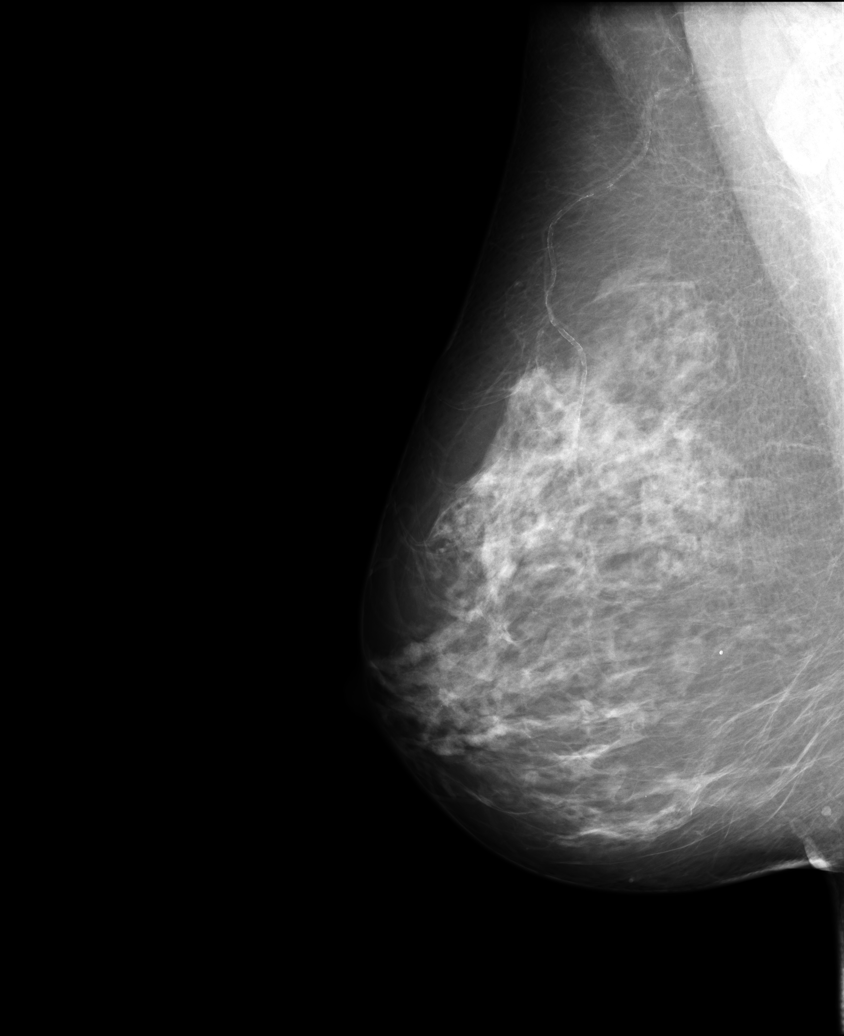
[im 4/5]
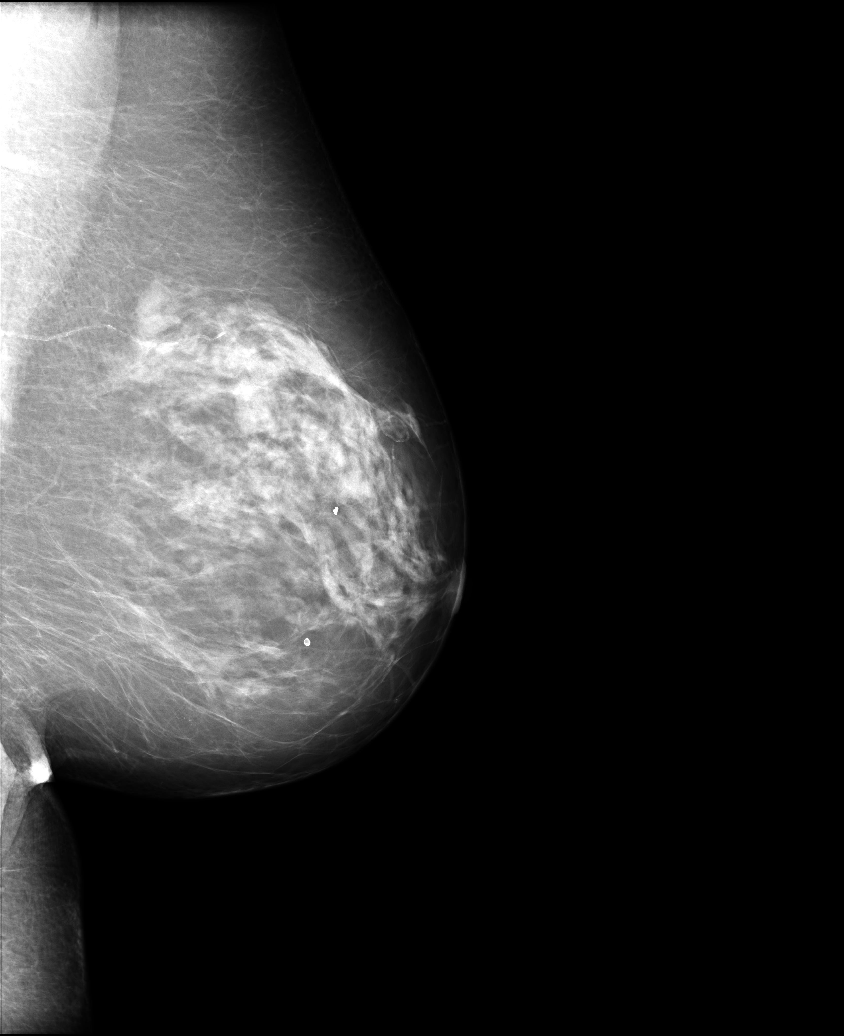
[im 5/5]
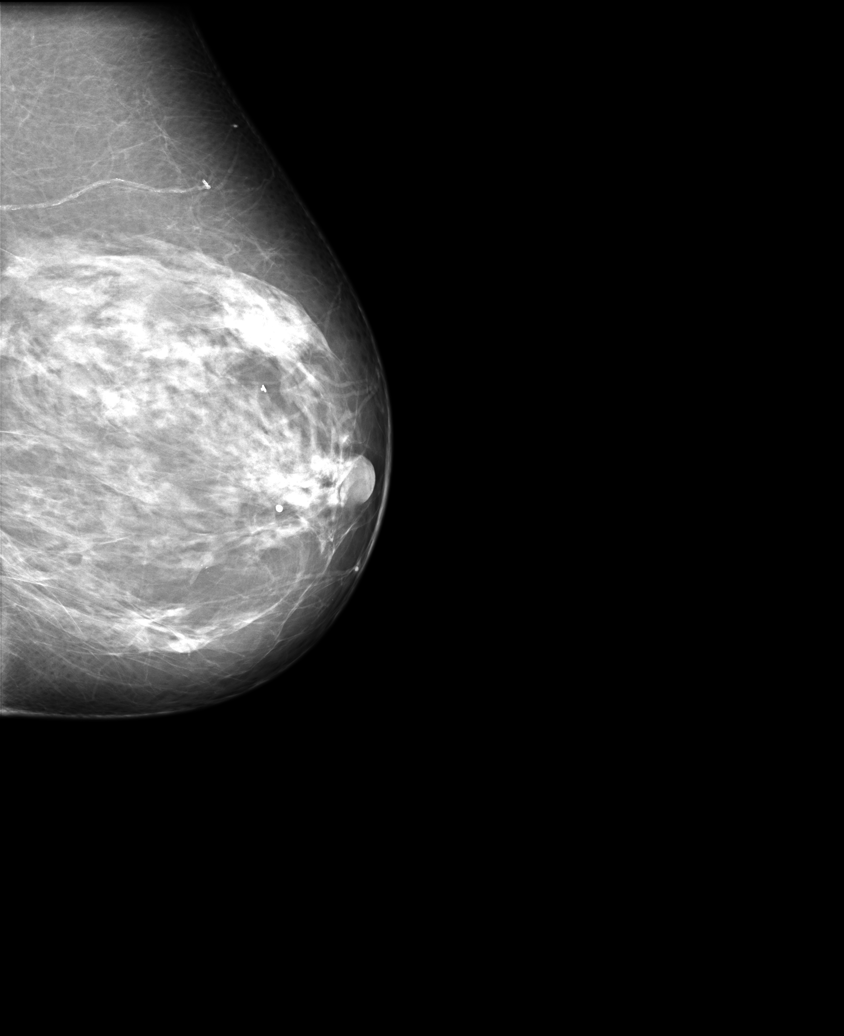

[5 of 5 positions shown; findings below may reference images not displayed]

FINDINGS: Dense breasts limits sensitivity to mammogram. No mass or architectural changes are seen. No abnormal calcific density, skin changes or nipple changes are seen.
IMPRESSION: Dense breasts limits sensitivity to mammogram. Mammographic findings are stable. 
Clinical and mammographic followup are indicated at twelve months. 

Final Assessment Code:
Bi-Rads 2, Dense

BI-RADS 0
Need additional imaging evaluation
BI-RADS 1
Negative mammogram
BI-RADS 2
Benign finding
BI-RADS 3
Probably benign finding: short-interval follow-up suggested
BI-RADS 4
Suspicious abnormality:  biopsy should be considered
BI-RADS 5
Highly suggestive of malignancy; appropriate action should be taken

NOTE:
In compliance with Federal regulations, the results of this mammogram are being sent to the patient.

________________________________
Nina Therese Abrehe., signed this document electronically

## 2014-01-27 IMAGING — MG MAMMO DIGITAL SCREENING
1 series · 4 of 4 positions shown · non-contrast
Comparison: 03/17/2014 and 03/19/2015.

------------- REPORT [id number] -------------
MAMMO DIGITAL SCREENING WITH CAD

CARTER, PRAVIN
Exam:  
Bilateral digital screening mammogram with CAD
HISTORY: Annual screening.
TECHNIQUE: Standard mammographic views with computed aided detection.

[R CC · oblique · right · 4 of 4 slices shown]
[im 1/4]
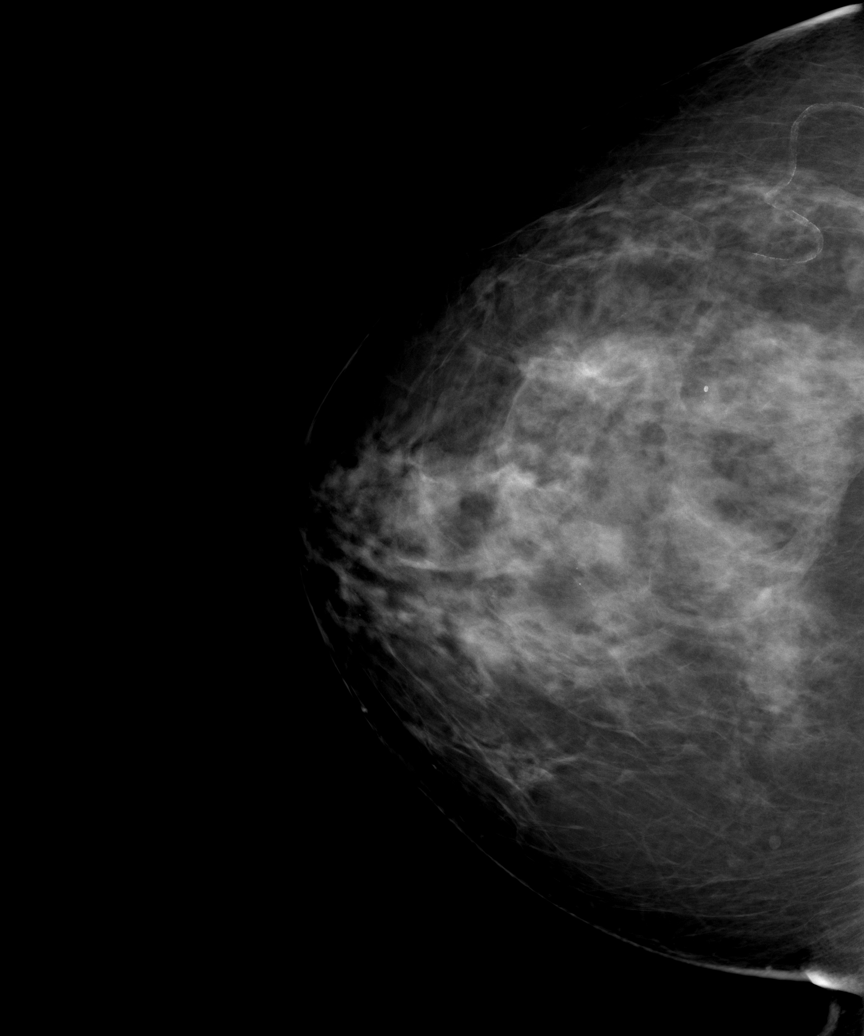
[im 2/4]
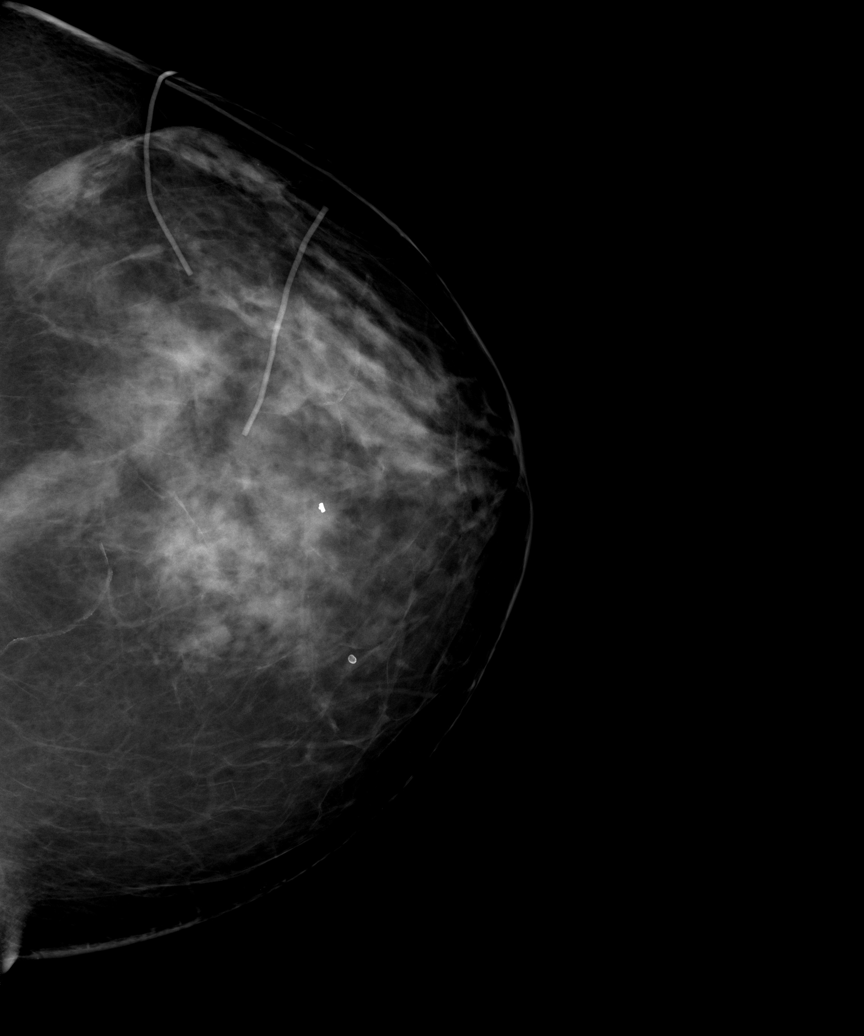
[im 3/4]
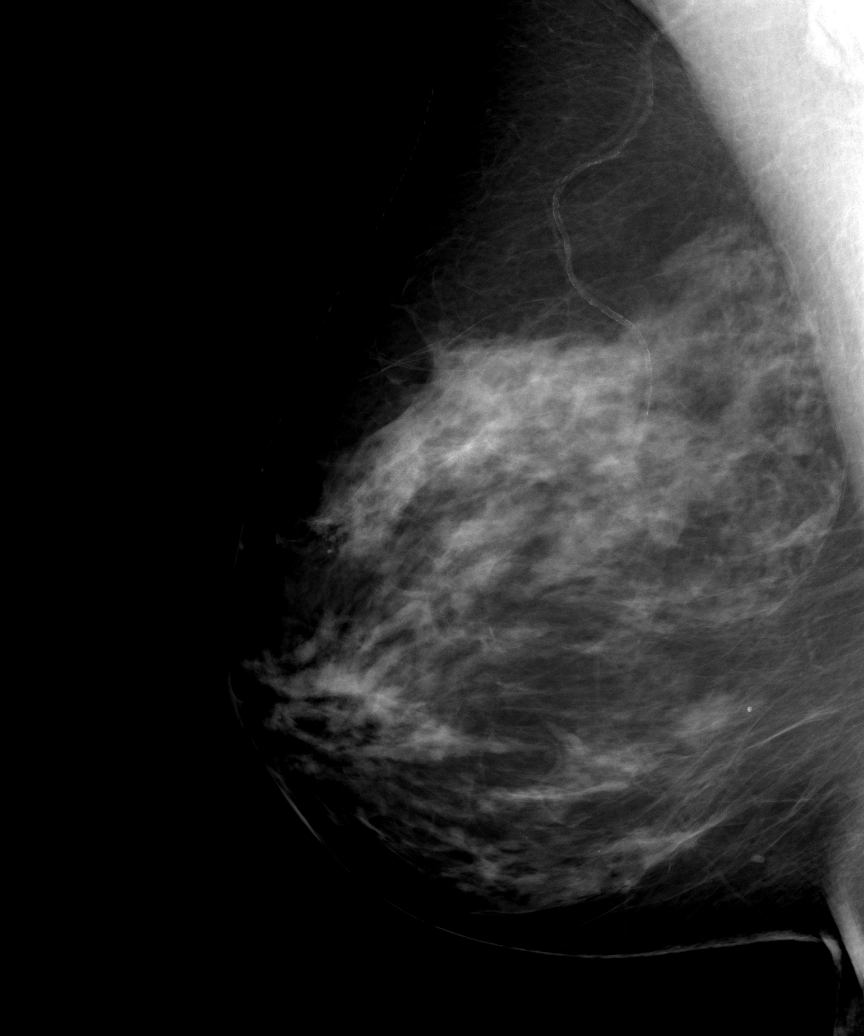
[im 4/4]
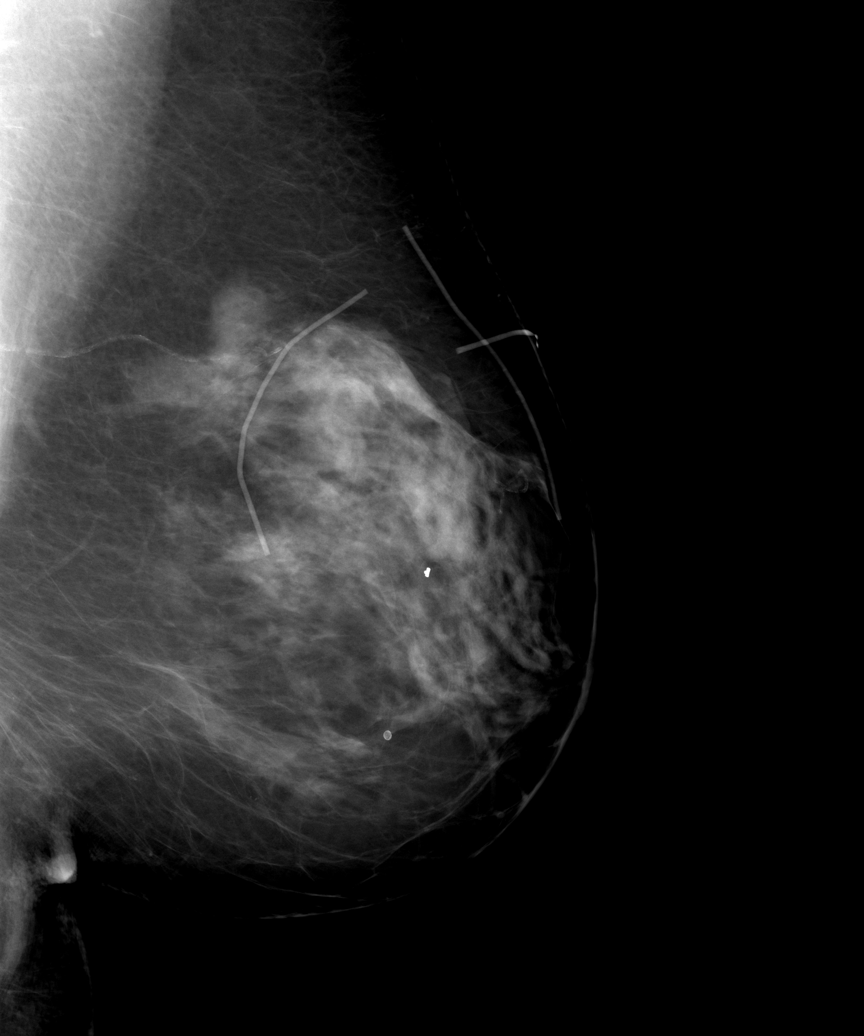

[4 of 4 positions shown; findings below may reference images not displayed]

FINDINGS: The breast parenchyma is heterogeneously dense. There are vascular and other benign calcifications bilaterally. There are no suspicious masses or suspicious microcalcifications. No skin thickening, nipple inversion or architectural distortion.
IMPRESSION: 1. Stable mammogram with no evidence of malignancy. 
RECOMMENDATION: Recommend continued breast cancer screening per American [employer] Society guidelines. 
Final Assessment Code:
Bi-Rads 2 (benign findings)

BI-RADS 0
Need additional imaging evaluation
BI-RADS 1
Negative mammogram
BI-RADS 2
Benign finding
BI-RADS 3
Probably benign finding: short-interval follow-up suggested
BI-RADS 4
Suspicious abnormality:  biopsy should be considered
BI-RADS 5
Highly suggestive of malignancy; appropriate action should be taken
BI-RADS 6
Known Biopsy-proven Malignancy  Appropriate action should be taken
NOTE:
In compliance with Federal regulations, the results of this mammogram are being sent to the patient.

COE, SAEL

Electronically Signed by COE, SAEL at 03/24/2016 [DATE]

------------- REPORT [id number] -------------
[facility]
[address]
Mor Ms.SHIM, MAGALI:
We wish to report the following on your recent mammography examination. We are sending a report to your referring physician or other health care provider. 
(       Normal/Negative:
No evidence of cancer.
This statement is mandated by the [place], Department of Health.
Your examination was performed by one of our technologists, who are registered radiological technologists and also specially certified in mammography:
___
Donjuan, Wavy (M)
___
Tena, Danielf (M)
___
Gia, Nelith (M)

Your mammogram was interpreted by our radiologist.

( 
Mmamontsho Seakolo, M.D.

(Annual Breast Examination by a physician or other health care provider
(Annual Mammography Screening beginning at age 40
(Monthly Breast Self Examination

## 2016-05-16 IMAGING — US US ABDOMEN BACK WALL COMPLETE
1 series · 14 of 25 positions shown · non-contrast
Comparison: CT abdomen and pelvis report from [facility] dated 04/22/2016 and CT abdomen and pelvis from our facility dated 02/08/2010.

Exam:   
Renal Sonogram, Complete
INDICATION: Follow up subcentimeter right renal lesion seen on recent CT scan.

[Series 3: us abdomen back wall complete · 0.20mm/px · 14 of 65 slices shown]
[im 1/65]
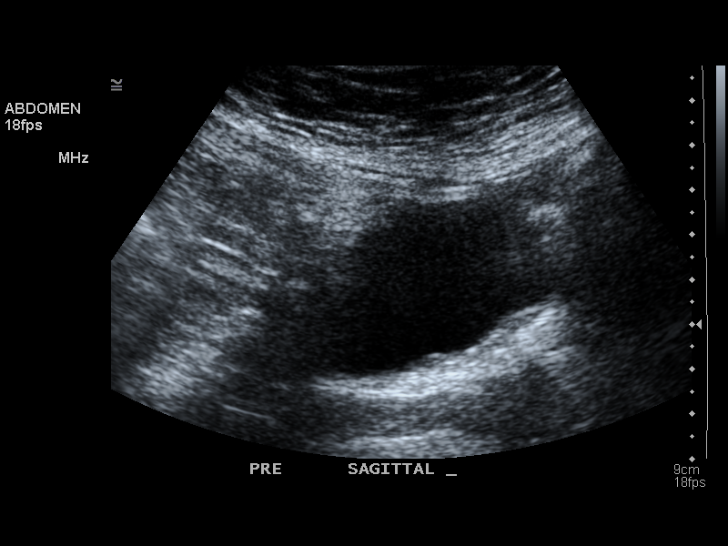
[im 6/65]
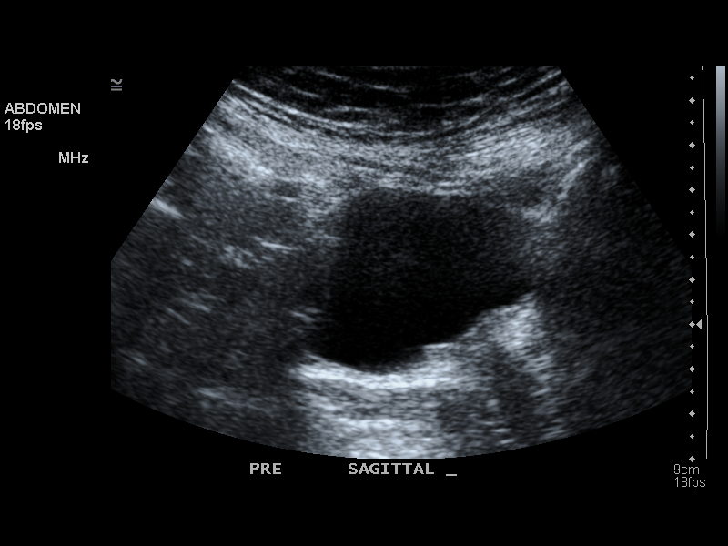
[im 11/65]
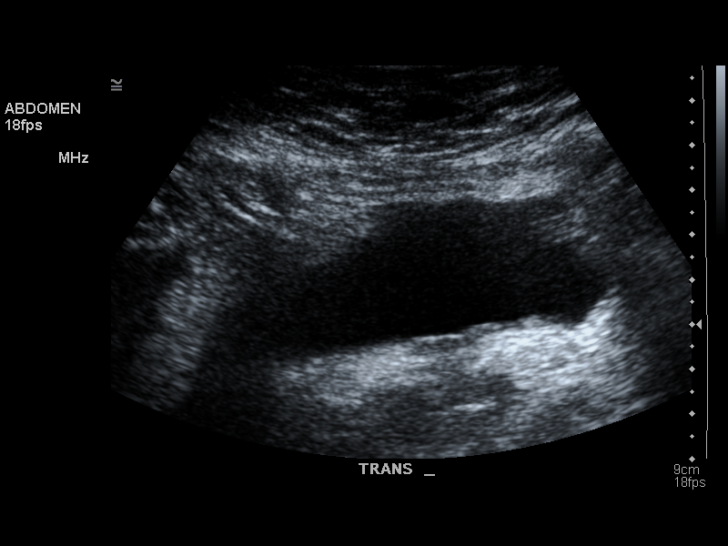
[im 17/65]
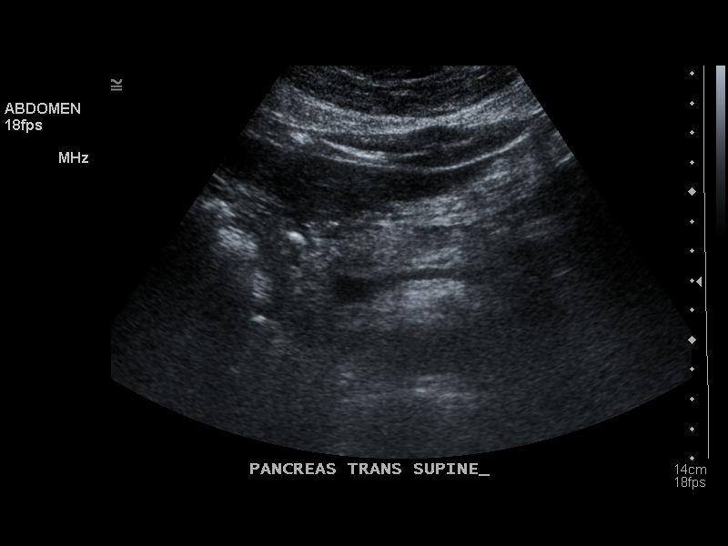
[im 22/65]
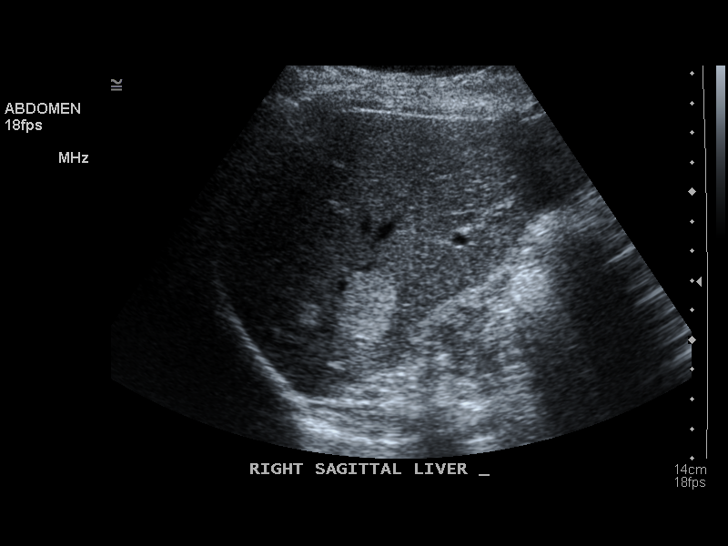
[im 25/65]
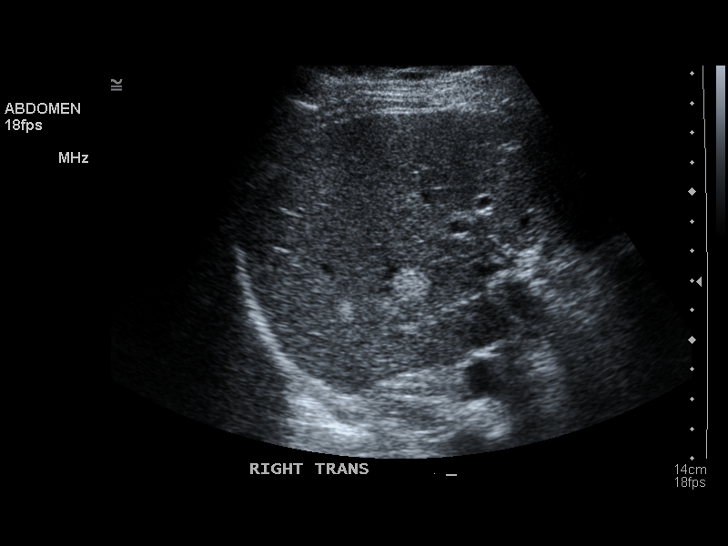
[im 30/65]
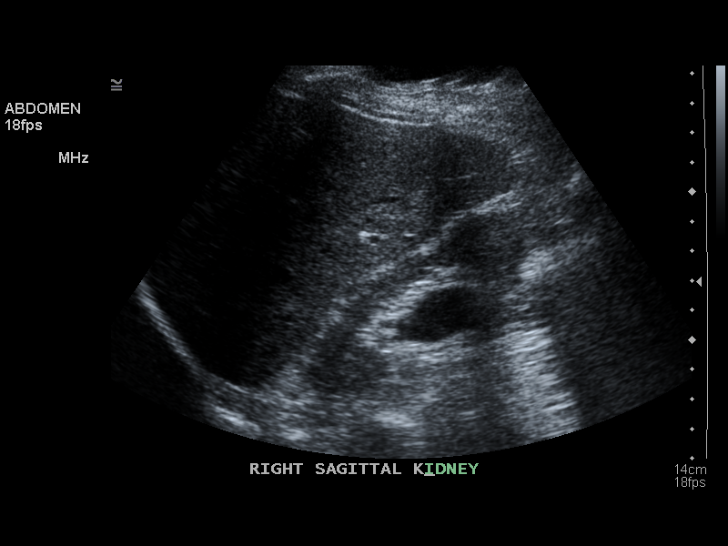
[im 35/65]
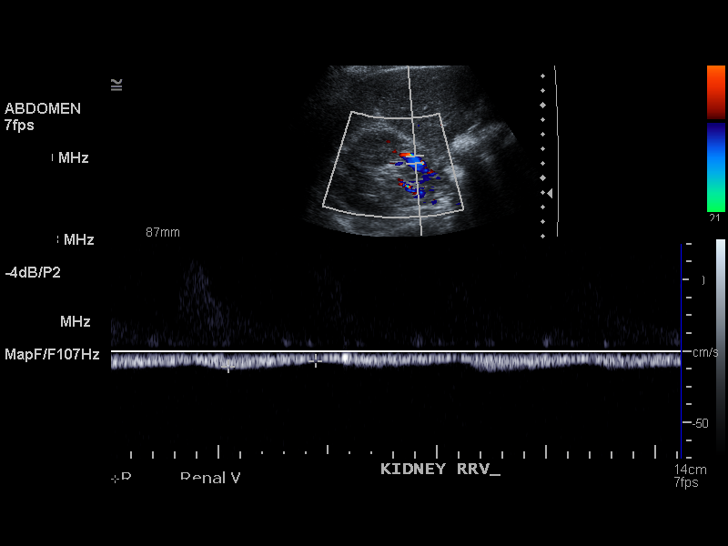
[im 41/65]
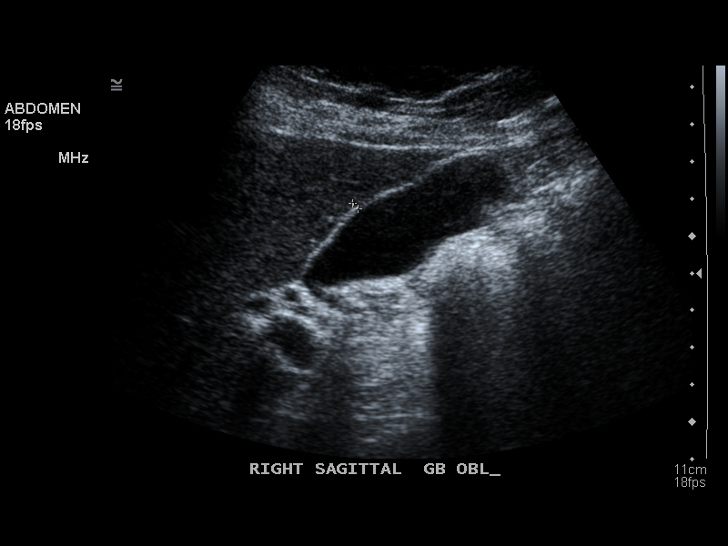
[im 43/65]
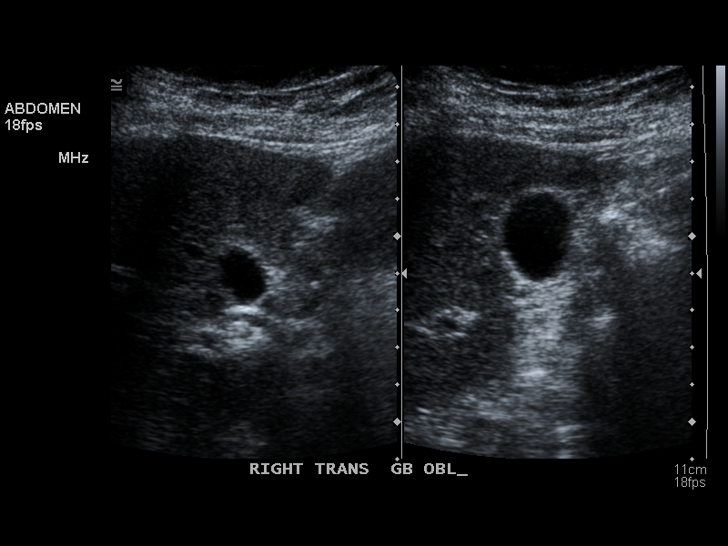
[im 49/65]
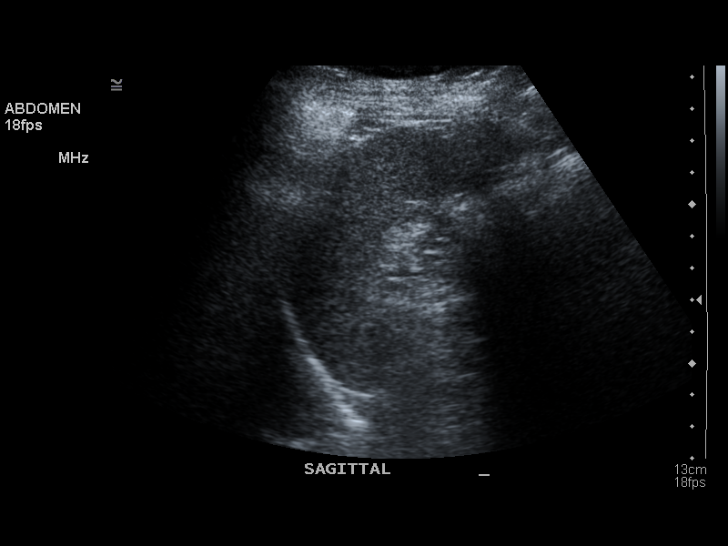
[im 54/65]
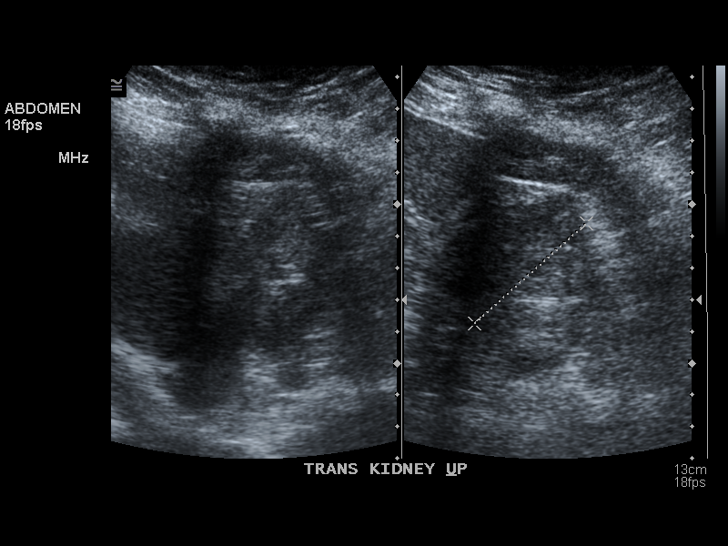
[im 59/65]
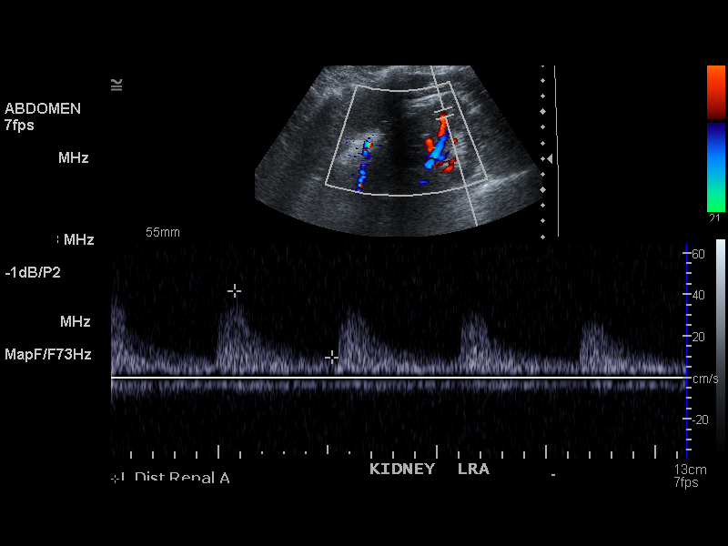
[im 65/65]
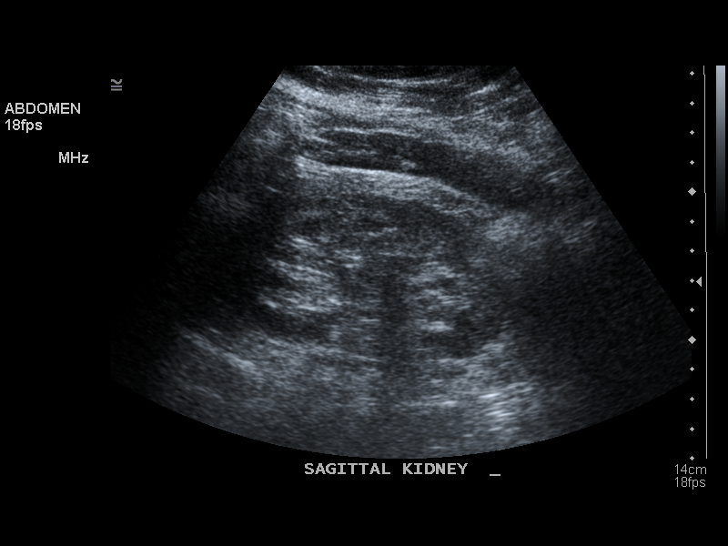

[14 of 25 positions shown; findings below may reference images not displayed]

FINDINGS: Kidneys are normal in echogenicity and measure 10 cm bilaterally.  No hydronephrosis, mass or cyst is seen on either side.  Prominence of the right renal pelvis appears unchanged.  

Urinary bladder is partially distended and grossly unremarkable.  Pre void bladder volume is 73.5 cubic centimeters and post void bladder volume is 44.5 cubic centimeters.  

A 2.5 cm right hepatic lobe hemangioma is also stable.
IMPRESSION: Unremarkable kidneys.  No definite right renal mass seen.  Follow up with contrast enhanced CT scan should be considered in 6-9 months for reassessment if clinically indicated.  

Stable 2.5 cm right hepatic lobe hemangioma.  

Significant post void residual.

## 2017-03-04 ENCOUNTER — Encounter (INDEPENDENT_AMBULATORY_CARE_PROVIDER_SITE_OTHER): Payer: Self-pay

## 2017-03-04 ENCOUNTER — Ambulatory Visit (INDEPENDENT_AMBULATORY_CARE_PROVIDER_SITE_OTHER): Payer: Medicare Other

## 2017-03-04 VITALS — BP 131/76 | HR 57 | Ht 65.0 in | Wt 171.0 lb

## 2017-03-04 DIAGNOSIS — I480 Paroxysmal atrial fibrillation: Secondary | ICD-10-CM

## 2017-03-04 NOTE — Progress Notes (Signed)
MEDICAL STAFF OFFICE [facility]  [facility]  [address], [address]  [place] [address]  [phone]            Patient name:  Kathy Morrison  MRN:  [id number]  DOB:  12-Jan-1946  DATE:  03/04/2017  AGE: 71 y.o.      Chief Complaint: Arrhythmia (referral for PAF )    History of Present Illness:  71 y.o. female with a history of paroxysmal atrial fibrillation who presents for evaluation. She was diagnosed with Afib several years ago. Initially rate control strategy was tried but failed. She has been doing well on flecainide but continues to have break through afib despite the dose increase.  She in interested in an ablation. No bleeding.      Past Medical History:  Past Medical History:   Diagnosis Date   . Atrial fibrillation ([place])    . Bilateral cataracts    . Bilateral hearing loss    . Bradycardia    . CAD (coronary artery disease)    . Cancer of skin, squamous cell     breast   . Edema of lower extremity    . HLD (hyperlipidemia)    . HTN (hypertension)    . Kidney lesion    . Knee pain    . Mitral valve disorder    . Osteopenia    . Paresthesia of upper limb    . Pelvic mass    . Vitamin D deficiency              Past Surgical History:   Procedure Laterality Date   . BREAST SURGERY     . COLONOSCOPY     . HX BREAST BIOPSY     . HX HYSTERECTOMY     . MOLE REMOVAL             Medications:  Current Outpatient Prescriptions   Medication Sig   . apixaban (ELIQUIS) 5 mg Oral Tablet Take 5 mg by mouth Twice daily   . biotin-keratin 10,000-100 mcg-mg Oral Tablet Take by mouth Once a day   . Cholecalciferol, Vitamin D3, (VITAMIN D-3) 5,000 unit Oral Tablet Take by mouth Once a day   . cinnamon bark (CINNAMON ORAL) Take 1,000 mg by mouth Once a day   . coenzyme Q10 100 mg Oral Capsule Take 100 mg by mouth Every evening with dinner   . flecainide (TAMBOCOR) 150 mg Oral Tablet Take 150 mg by mouth Twice daily   . hydroCHLOROthiazide (HYDRODIURIL) 12.5 mg Oral Tablet Take 12.5 mg by mouth Once a day    . multivit with minerals/lutein (MULTIVITAMIN 50 PLUS ORAL) Take by mouth Once a day   . pravastatin (PRAVACHOL) 20 mg Oral Tablet Take 20 mg by mouth Once a day       Allergies:  Allergies   Allergen Reactions   . Codeine    . Simvastatin        Family History:  Family Medical History     Problem Relation (Age of Onset)    COPD Brother    Cancer Father, Brother    Diabetes Mother, Brother, Brother, Brother, Brother    Healthy Daughter    High Cholesterol Son    Kidney Disease Brother    Parkinsons Disease Brother    Stroke Mother              Social History:  Social History   Substance Use Topics   . Smoking status: Former Research scientist (life sciences)   .  Smokeless tobacco: Never Used   . Alcohol use No       Review of Systems:  11 point review of systems is otherwise negative except per hpi and pmh.    Examination:  BP 131/76  Pulse 57  Ht 1.651 m (5\' 5" )  Wt 77.6 kg (171 lb)  BMI 28.46 kg/m2  GEN- AO, NAD  HEENT- Anicteric, OP mmm  NECK- Supple, no nuchal rigidity, no elevated JVD  PULM- CTAB, no w/r/s/c/acc muscle use  C/V- RR, S1 S2, no m/r/g, no RV heave  ABD- NABS, soft, nt/nd  EXT- No LE edema, RP 2+ Bil  NM- Sitting up independently  PSYCH- Normal mood and affect    Data reviewed:  An ECG dated today shows sinus rhythm rate 54 beats per minute, normal axis and intervals    Assessment and Plan:  1. PAF (paroxysmal atrial fibrillation) ([place])        Ms. Fleet has PAF having failed medical therapy. Discussed changing her medications to Tikosyn (bradycardia prevents sotalol use) or an Afib ablation. She would like to get off medications. Discussed she would need to stay on Eliquis post procedure for stroke prevention. The procedure, indications, available alternatives, potential complications (including but not limited to bleeding, deep vein thrombosis, life threatening cardiac perforation, cardiac tamponade, stroke, myocardial infarction, pulmonary vein stenosis, fistula to adjacent anatomic structures, damage to mitral valve  apparatus, asystole, conduction system damage necessitating a pacemaker, aspiration, phrenic nerve damage, adverse medication reaction, and death) were explained to the patient and he agrees to proceed.      continue flecainide for now    It has been a pleasure to see your patient here at the [facility]. Please do not hesitate to contact us with any questions or concerns.    Isaias Cowman, MD  03/04/2017, 11:08

## 2017-03-04 NOTE — Patient Instructions (Signed)
followup for afib ablation

## 2017-03-04 NOTE — Procedures (Signed)
See progress notes

## 2017-03-12 ENCOUNTER — Encounter (INDEPENDENT_AMBULATORY_CARE_PROVIDER_SITE_OTHER): Payer: Self-pay

## 2017-04-24 DIAGNOSIS — I480 Paroxysmal atrial fibrillation: Secondary | ICD-10-CM

## 2017-04-25 ENCOUNTER — Other Ambulatory Visit (INDEPENDENT_AMBULATORY_CARE_PROVIDER_SITE_OTHER): Payer: Self-pay

## 2017-04-25 DIAGNOSIS — R001 Bradycardia, unspecified: Secondary | ICD-10-CM

## 2017-04-25 DIAGNOSIS — I4891 Unspecified atrial fibrillation: Secondary | ICD-10-CM

## 2017-04-25 DIAGNOSIS — Z9889 Other specified postprocedural states: Secondary | ICD-10-CM

## 2017-06-03 ENCOUNTER — Encounter (INDEPENDENT_AMBULATORY_CARE_PROVIDER_SITE_OTHER): Payer: Medicare Other

## 2021-02-20 IMAGING — MR MRI SHOULDER LT W/O CONTRAST
6 series · 39 of 40 positions shown · IV contrast (gadolinium)
Comparison: None available.

﻿EXAM:  [id number]   MRI SHOULDER LT W/O CONTRAST
INDICATION: Chronic shoulder pain.
TECHNIQUE: Multiplanar multisequential MRI of the left shoulder joint was performed without gadolinium contrast.

[Series 6: T1 · sagittal · left · 3.5mm · 0.33mm/px · 6 of 21 slices shown]
[im 1/21]
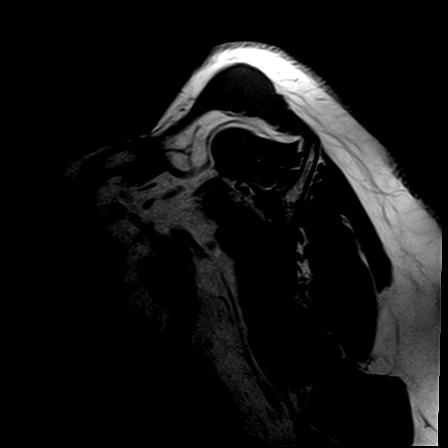
[im 5/21]
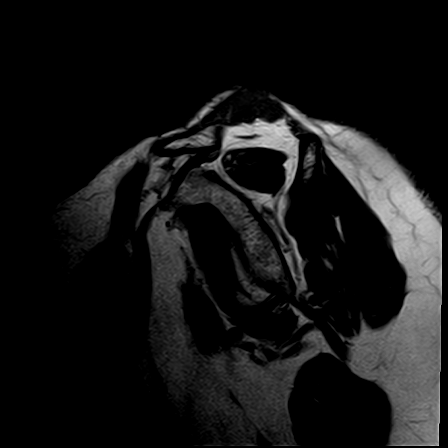
[im 9/21]
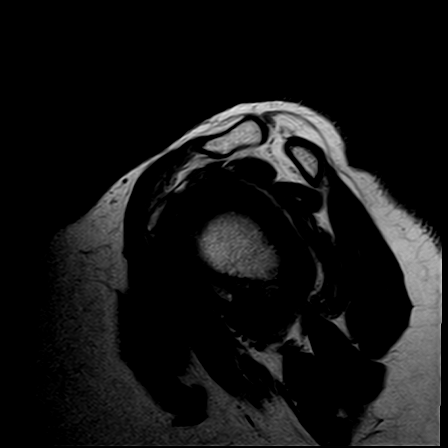
[im 13/21]
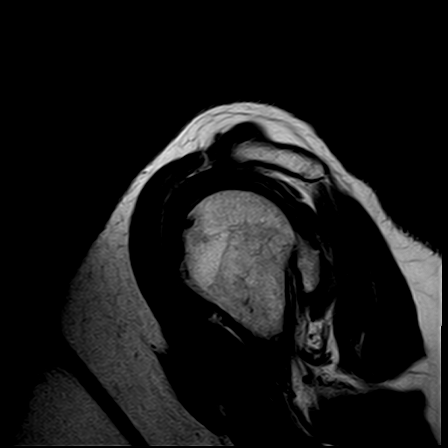
[im 17/21]
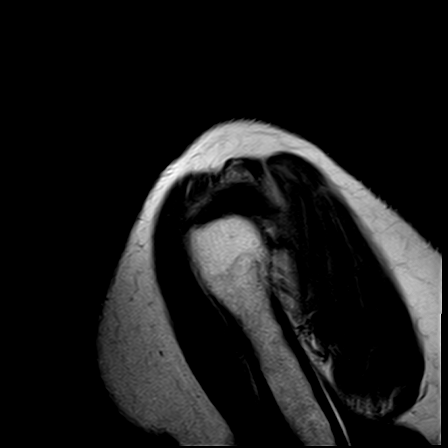
[im 21/21]
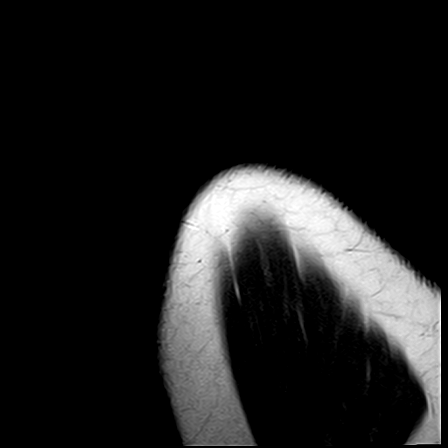

[Series 7: PD fat-sat · axial · left · 4.0mm · 0.50mm/px · z∈[-95,-9]mm · 6 of 21 slices shown (1 of 2)]
[im 1/21]
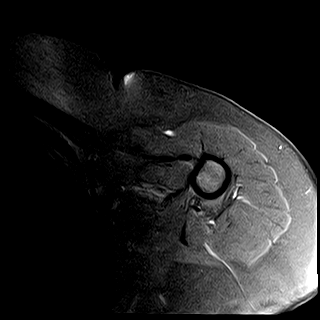
[im 5/21]
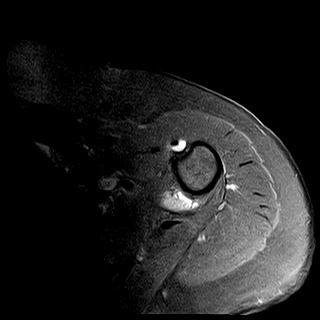
[im 9/21]
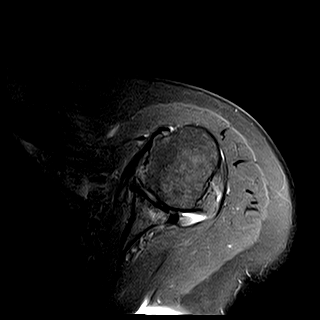
[im 13/21]
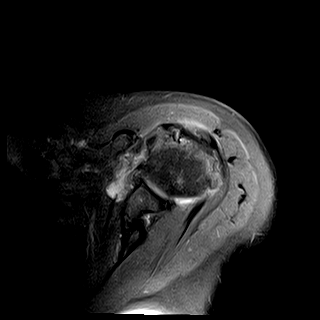
[im 17/21]
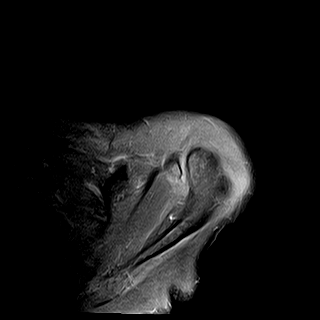
[im 21/21]
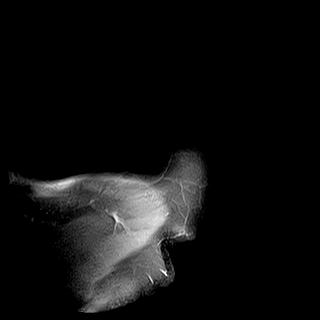

[Series 8: T2 fat-sat · axial · left · 4.0mm · 0.42mm/px · z∈[-95,-9]mm · 7 of 21 slices shown]
[im 1/21]
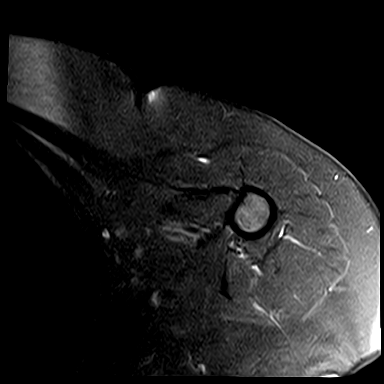
[im 4/21]
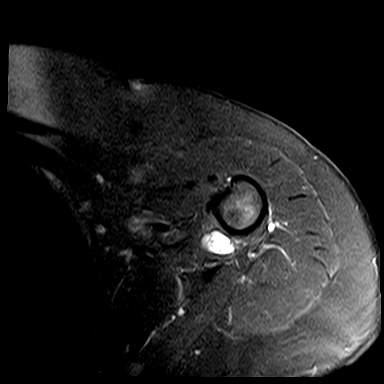
[im 7/21]
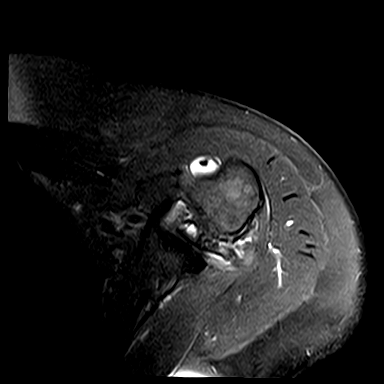
[im 11/21]
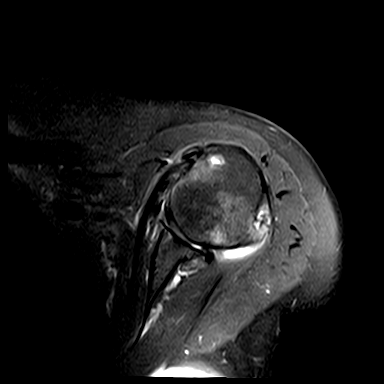
[im 14/21]
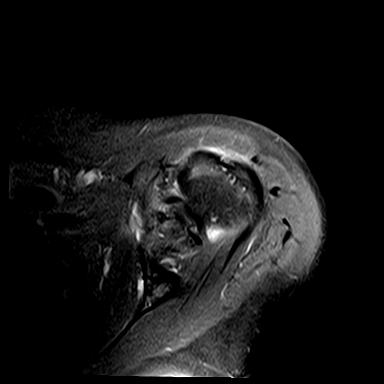
[im 17/21]
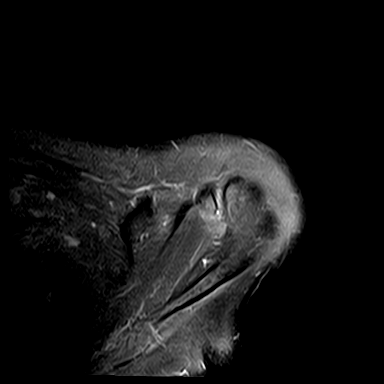
[im 21/21]
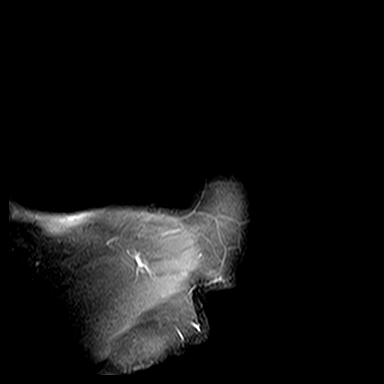

[Series 9: STIR · sagittal · left · 3.5mm · 0.47mm/px · 7 of 21 slices shown (1 of 2)]
[im 1/21]
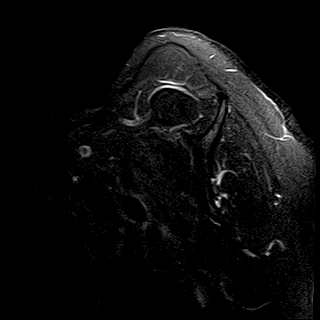
[im 4/21]
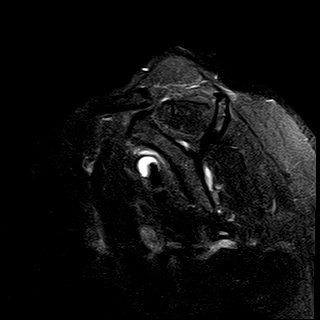
[im 7/21]
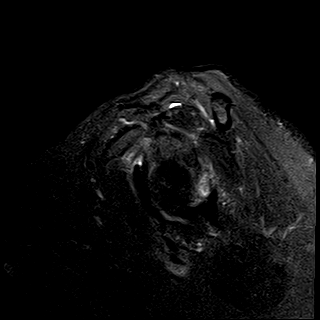
[im 11/21]
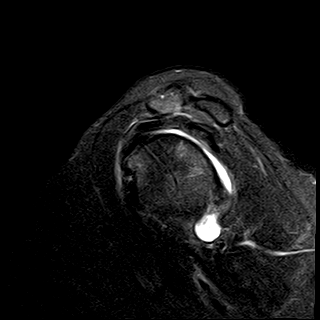
[im 14/21]
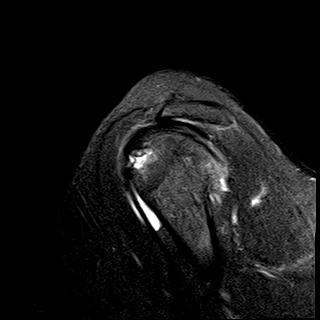
[im 17/21]
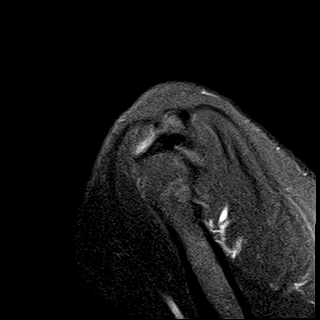
[im 21/21]
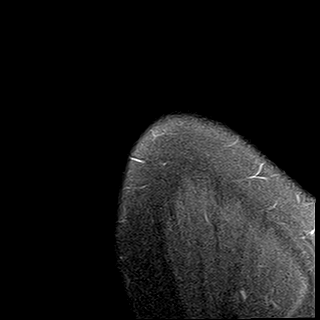

[Series 10: PD fat-sat · oblique · left · 3.5mm · 0.47mm/px · 7 of 21 slices shown (2 of 2)]
[im 1/21]
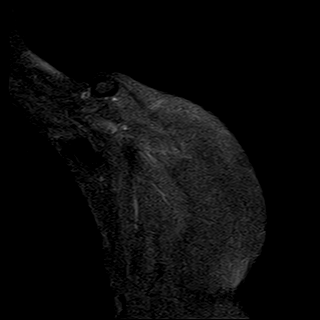
[im 4/21]
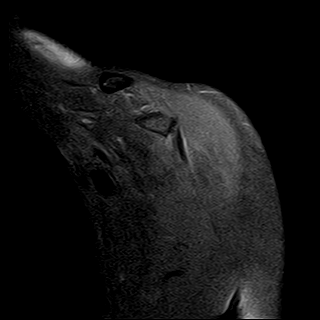
[im 7/21]
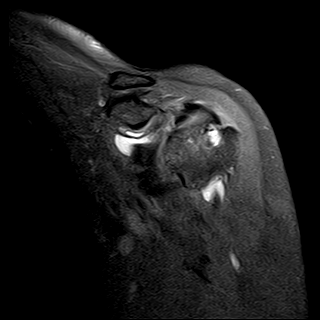
[im 11/21]
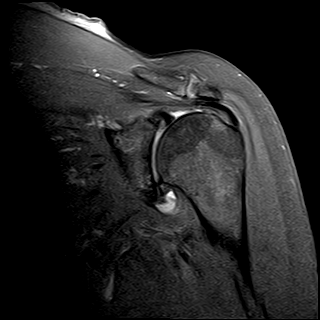
[im 14/21]
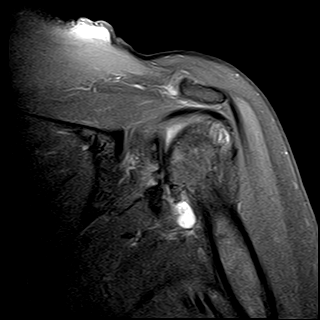
[im 17/21]
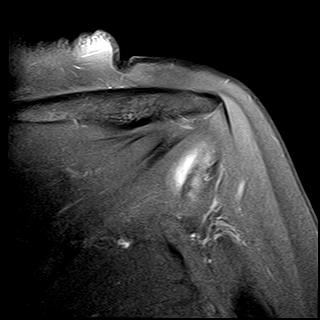
[im 21/21]
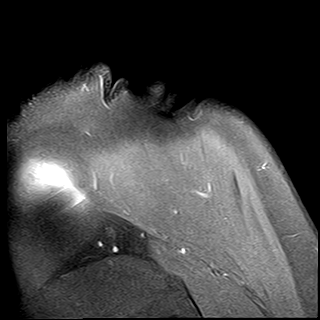

[Series 11: STIR · oblique · left · 3.5mm · 0.47mm/px · 6 of 21 slices shown (2 of 2)]
[im 1/21]
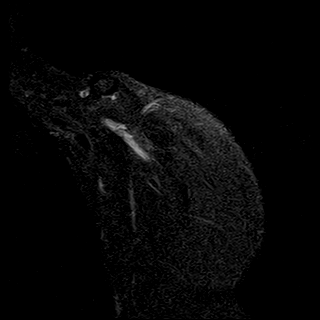
[im 4/21]
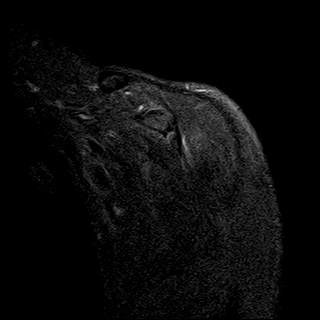
[im 7/21]
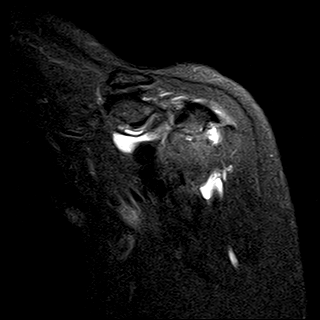
[im 11/21]
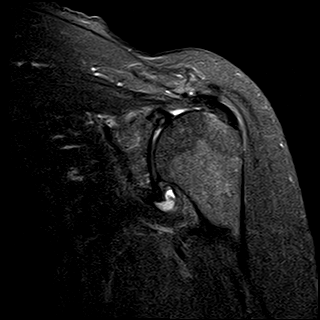
[im 14/21]
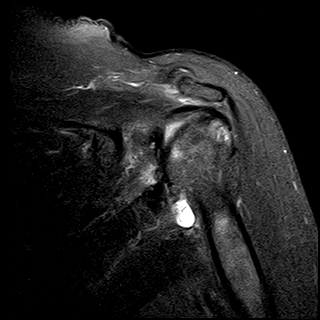
[im 17/21]
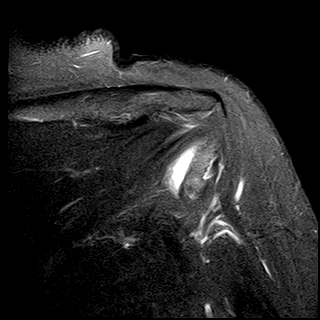

[39 of 40 positions shown; findings below may reference images not displayed]

FINDINGS: There is mild supraspinatus tendinopathy. Infraspinatus, teres minor and subscapularis muscles and tendons are within normal limits in morphology and signal intensity. Long head of biceps tendon is well seated within the intertubercular groove. There is suggestion of complex superior and posterior labral tears. Near complete glenohumeral articular cartilage loss is also identified. There is also a small glenohumeral joint effusion. There is mild acromioclavicular joint osteoarthritis. There is trace fluid within the subacromial/subdeltoid bursa. Muscle bulk and bone marrow signal intensity are normal. No mass is seen along the course of the suprascapular nerve, within the spinoglenoid notch or within the quadrilateral space.
IMPRESSION: 1. Mild supraspinatus tendinopathy. 

2. Near complete glenohumeral articular cartilage loss with suggestion of complex superior and posterior labral tears. 

3. Mild acromioclavicular joint osteoarthritis.

## 2022-01-09 ENCOUNTER — Other Ambulatory Visit: Payer: Medicare Other | Attending: Orthopaedic Surgery

## 2022-01-09 ENCOUNTER — Other Ambulatory Visit: Payer: Self-pay

## 2022-01-09 DIAGNOSIS — Z01818 Encounter for other preprocedural examination: Secondary | ICD-10-CM | POA: Insufficient documentation

## 2022-01-09 LAB — BASIC METABOLIC PANEL
ANION GAP: 7 mmol/L — ABNORMAL LOW (ref 10–20)
BUN/CREA RATIO: 17 (ref 6–22)
BUN: 15 mg/dL (ref 7–25)
CALCIUM: 9.5 mg/dL (ref 8.6–10.3)
CHLORIDE: 103 mmol/L (ref 98–107)
CO2 TOTAL: 32 mmol/L — ABNORMAL HIGH (ref 21–31)
CREATININE: 0.87 mg/dL (ref 0.60–1.30)
ESTIMATED GFR: 69 mL/min/{1.73_m2} (ref >59)
GLUCOSE: 127 mg/dL — ABNORMAL HIGH (ref 74–109)
OSMOLALITY, CALCULATED: 286 mOsm/kg (ref 270–290)
POTASSIUM: 3.5 mmol/L (ref 3.5–5.1)
SODIUM: 142 mmol/L (ref 136–145)

## 2022-01-09 LAB — URINALYSIS, MACROSCOPIC
BILIRUBIN: NEGATIVE mg/dL
BLOOD: NEGATIVE mg/dL
GLUCOSE: NEGATIVE mg/dL
KETONES: NEGATIVE mg/dL
LEUKOCYTES: NEGATIVE WBCs/uL
NITRITE: NEGATIVE
PH: 6.5 (ref 5.0–9.0)
PROTEIN: NEGATIVE mg/dL
SPECIFIC GRAVITY: 1.019 (ref 1.002–1.030)
UROBILINOGEN: NORMAL mg/dL

## 2022-01-09 LAB — CBC WITH DIFF
BASOPHIL #: 0 10*3/uL (ref 0.00–2.50)
BASOPHIL %: 1 % (ref 0–3)
EOSINOPHIL #: 0.1 10*3/uL (ref 0.00–2.40)
EOSINOPHIL %: 1 % (ref 0–7)
HCT: 43.6 % (ref 37.0–47.0)
HGB: 14.5 g/dL (ref 12.5–16.0)
LYMPHOCYTE #: 1.7 10*3/uL — ABNORMAL LOW (ref 2.10–11.00)
LYMPHOCYTE %: 27 % (ref 25–45)
MCH: 28.8 pg (ref 27.0–32.0)
MCHC: 33.3 g/dL (ref 32.0–36.0)
MCV: 86.5 fL (ref 78.0–99.0)
MONOCYTE #: 0.5 10*3/uL (ref 0.00–4.10)
MONOCYTE %: 7 % (ref 0–12)
MPV: 8.2 fL (ref 7.4–10.4)
NEUTROPHIL #: 4 10*3/uL — ABNORMAL LOW (ref 4.10–29.00)
NEUTROPHIL %: 64 % (ref 40–76)
PLATELETS: 274 10*3/uL (ref 140–440)
RBC: 5.04 10*6/uL (ref 4.20–5.40)
RDW: 13.8 % (ref 11.6–14.8)
WBC: 6.3 10*3/uL (ref 4.0–10.5)
WBCS UNCORRECTED: 6.3 10*3/uL

## 2022-01-09 LAB — HGA1C (HEMOGLOBIN A1C WITH EST AVG GLUCOSE): HEMOGLOBIN A1C: 6 % (ref 4.0–6.0)

## 2022-01-09 LAB — URINALYSIS, MICROSCOPIC: SQUAMOUS EPITHELIAL: <1 /hpf (ref <28)

## 2022-01-31 ENCOUNTER — Ambulatory Visit (HOSPITAL_COMMUNITY): Payer: Medicare Other | Admitting: Anesthesiology

## 2022-01-31 ENCOUNTER — Other Ambulatory Visit: Payer: Self-pay

## 2022-01-31 ENCOUNTER — Inpatient Hospital Stay (HOSPITAL_COMMUNITY): Payer: Medicare Other | Admitting: Orthopaedic Surgery

## 2022-01-31 ENCOUNTER — Encounter (HOSPITAL_COMMUNITY): Payer: Self-pay | Admitting: Orthopaedic Surgery

## 2022-01-31 ENCOUNTER — Encounter (HOSPITAL_COMMUNITY)
Admission: RE | Disposition: A | Discharge status: Home / Self Care | Payer: Self-pay | Source: Ambulatory Visit | Attending: Orthopaedic Surgery

## 2022-01-31 ENCOUNTER — Inpatient Hospital Stay
Admission: RE | Admit: 2022-01-31 | Discharge: 2022-02-01 | DRG: 483 | Disposition: A | Discharge status: Home / Self Care | Payer: Medicare Other | Source: Ambulatory Visit | Attending: Orthopaedic Surgery | Admitting: Orthopaedic Surgery

## 2022-01-31 DIAGNOSIS — E785 Hyperlipidemia, unspecified: Secondary | ICD-10-CM | POA: Insufficient documentation

## 2022-01-31 DIAGNOSIS — Z96659 Presence of unspecified artificial knee joint: Principal | ICD-10-CM | POA: Diagnosis present

## 2022-01-31 DIAGNOSIS — I4891 Unspecified atrial fibrillation: Secondary | ICD-10-CM | POA: Diagnosis present

## 2022-01-31 DIAGNOSIS — M19012 Primary osteoarthritis, left shoulder: Principal | ICD-10-CM | POA: Insufficient documentation

## 2022-01-31 DIAGNOSIS — I10 Essential (primary) hypertension: Secondary | ICD-10-CM | POA: Insufficient documentation

## 2022-01-31 SURGERY — ARTHROPLASTY SHOULDER
Anesthesia: General | Site: Shoulder | Laterality: Left | Wound class: Clean Wound: Uninfected operative wounds in which no inflammation occurred

## 2022-01-31 MED ORDER — PROPOFOL 10 MG/ML IV BOLUS
INJECTION | Freq: Once | INTRAVENOUS | Status: DC | PRN
Start: 2022-01-31 — End: 2022-01-31
  Administered 2022-01-31: 130 mg via INTRAVENOUS

## 2022-01-31 MED ORDER — CHOLECALCIFEROL (VITAMIN D3) 25 MCG (1,000 UNIT) TABLET
5000.0000 [IU] | ORAL_TABLET | Freq: Every day | ORAL | Status: DC
Start: 2022-01-31 — End: 2022-02-01
  Administered 2022-01-31: 0 [IU] via ORAL
  Administered 2022-02-01: 5000 [IU] via ORAL
  Filled 2022-01-31 (×2): qty 5

## 2022-01-31 MED ORDER — LACTATED RINGERS INTRAVENOUS SOLUTION
INTRAVENOUS | Status: DC
Start: 2022-01-31 — End: 2022-01-31

## 2022-01-31 MED ORDER — MULTIVITAMIN-FERROUS FUMARATE-FOLIC ACID 18 MG-400 MCG TABLET
1.0000 | ORAL_TABLET | Freq: Every day | ORAL | Status: DC
Start: 2022-01-31 — End: 2022-01-31

## 2022-01-31 MED ORDER — OXYCODONE 5 MG TABLET
10.0000 mg | ORAL_TABLET | ORAL | Status: DC | PRN
Start: 2022-01-31 — End: 2022-02-01

## 2022-01-31 MED ORDER — ACETAMINOPHEN 325 MG TABLET
650.0000 mg | ORAL_TABLET | ORAL | Status: DC | PRN
Start: 2022-01-31 — End: 2022-02-01

## 2022-01-31 MED ORDER — ROCURONIUM 10 MG/ML INTRAVENOUS SYRINGE WRAPPER
INJECTION | Freq: Once | INTRAVENOUS | Status: DC | PRN
Start: 2022-01-31 — End: 2022-01-31
  Administered 2022-01-31: 40 mg via INTRAVENOUS

## 2022-01-31 MED ORDER — SODIUM CHLORIDE 0.9 % (FLUSH) INJECTION SYRINGE
3.0000 mL | INJECTION | Freq: Three times a day (TID) | INTRAMUSCULAR | Status: DC
Start: 2022-01-31 — End: 2022-02-01
  Administered 2022-01-31: 0 mL
  Administered 2022-01-31: 3 mL

## 2022-01-31 MED ORDER — TRANEXAMIC ACID 1000 MG IN NS 50 ML IVPB - ANES
Freq: Once | INTRAVENOUS | Status: DC | PRN
Start: 2022-01-31 — End: 2022-01-31
  Administered 2022-01-31: 1000 mg via INTRAVENOUS

## 2022-01-31 MED ORDER — FENTANYL (PF) 50 MCG/ML INJECTION WRAPPER
INJECTION | Freq: Once | INTRAMUSCULAR | Status: DC | PRN
Start: 2022-01-31 — End: 2022-01-31
  Administered 2022-01-31: 50 ug via INTRAVENOUS
  Administered 2022-01-31 (×2): 25 ug via INTRAVENOUS

## 2022-01-31 MED ORDER — ALBUTEROL SULFATE 2.5 MG/3 ML (0.083 %) SOLUTION FOR NEBULIZATION
2.5000 mg | INHALATION_SOLUTION | Freq: Once | RESPIRATORY_TRACT | Status: DC | PRN
Start: 2022-01-31 — End: 2022-01-31

## 2022-01-31 MED ORDER — ROPIVACAINE (PF) 2 MG/ML (0.2 %) INJECTION SOLUTION
Freq: Once | INTRAMUSCULAR | Status: DC | PRN
Start: 2022-01-31 — End: 2022-01-31
  Administered 2022-01-31: 10 mL via EPIDURAL

## 2022-01-31 MED ORDER — FENTANYL (PF) 50 MCG/ML INJECTION SOLUTION
INTRAMUSCULAR | Status: AC
Start: 2022-01-31 — End: 2022-01-31
  Filled 2022-01-31: qty 2

## 2022-01-31 MED ORDER — GABAPENTIN 300 MG CAPSULE
ORAL_CAPSULE | ORAL | Status: AC
Start: 2022-01-31 — End: 2022-01-31
  Filled 2022-01-31: qty 1

## 2022-01-31 MED ORDER — ETHYL ALCOHOL 62 % (NOZIN NASAL SANITIZER) NASAL SOLUTION - BULK BOTTLE
3.0000 | Freq: Once | NASAL | Status: AC
Start: 2022-01-31 — End: 2022-01-31
  Administered 2022-01-31: 3 via NASAL

## 2022-01-31 MED ORDER — NALOXONE 0.4 MG/ML INJECTION SOLUTION
0.4000 mg | INTRAMUSCULAR | Status: DC | PRN
Start: 2022-01-31 — End: 2022-02-01

## 2022-01-31 MED ORDER — BISACODYL 10 MG RECTAL SUPPOSITORY
10.0000 mg | Freq: Every day | RECTAL | Status: DC | PRN
Start: 2022-01-31 — End: 2022-02-01
  Filled 2022-01-31: qty 1

## 2022-01-31 MED ORDER — DEXAMETHASONE SODIUM PHOSPHATE (PF) 10 MG/ML INJECTION SOLUTION
INTRAMUSCULAR | Status: AC
Start: 2022-01-31 — End: 2022-01-31
  Filled 2022-01-31: qty 1

## 2022-01-31 MED ORDER — VANCOMYCIN 10 GRAM INTRAVENOUS SOLUTION
1500.0000 mg | Freq: Once | INTRAVENOUS | Status: AC
Start: 2022-01-31 — End: 2022-01-31
  Administered 2022-01-31: 15:00:00 250 mg via INTRAVENOUS
  Administered 2022-01-31: 1500 mg via INTRAVENOUS
  Filled 2022-01-31: qty 15

## 2022-01-31 MED ORDER — CELECOXIB 100 MG CAPSULE
200.0000 mg | ORAL_CAPSULE | Freq: Once | ORAL | Status: AC
Start: 2022-01-31 — End: 2022-01-31
  Administered 2022-01-31: 200 mg via ORAL

## 2022-01-31 MED ORDER — OXYCODONE ER 10 MG TABLET,CRUSH RESISTANT,EXTENDED RELEASE 12 HR
10.0000 mg | EXTENDED_RELEASE_ORAL_TABLET | Freq: Once | ORAL | Status: AC
Start: 2022-01-31 — End: 2022-01-31
  Administered 2022-01-31: 10 mg via ORAL
  Filled 2022-01-31: qty 1

## 2022-01-31 MED ORDER — APREPITANT 40 MG CAPSULE
40.0000 mg | ORAL_CAPSULE | Freq: Once | ORAL | Status: AC
Start: 2022-01-31 — End: 2022-01-31
  Administered 2022-01-31: 40 mg via ORAL

## 2022-01-31 MED ORDER — ROPIVACAINE (PF) 2 MG/ML (0.2 %) INJECTION SOLUTION
INTRAMUSCULAR | Status: AC
Start: 2022-01-31 — End: 2022-01-31
  Filled 2022-01-31: qty 10

## 2022-01-31 MED ORDER — POTASSIUM CHLORIDE 20 MEQ/L IN 0.9 % SODIUM CHLORIDE INTRAVENOUS
INTRAVENOUS | Status: DC
Start: 2022-01-31 — End: 2022-02-01
  Administered 2022-01-31: 0 mL/h via INTRAVENOUS
  Filled 2022-01-31: qty 1000

## 2022-01-31 MED ORDER — APIXABAN 5 MG TABLET
5.0000 mg | ORAL_TABLET | Freq: Two times a day (BID) | ORAL | Status: DC
Start: 2022-01-31 — End: 2022-02-01
  Administered 2022-01-31 – 2022-02-01 (×2): 5 mg via ORAL
  Filled 2022-01-31 (×3): qty 1

## 2022-01-31 MED ORDER — OXYCODONE ER 10 MG TABLET,CRUSH RESISTANT,EXTENDED RELEASE 12 HR
10.0000 mg | EXTENDED_RELEASE_ORAL_TABLET | Freq: Once | ORAL | Status: AC
Start: 2022-01-31 — End: 2022-01-31
  Administered 2022-01-31: 10 mg via ORAL

## 2022-01-31 MED ORDER — LACTATED RINGERS INTRAVENOUS SOLUTION
INTRAVENOUS | Status: DC
Start: 2022-01-31 — End: 2022-02-01

## 2022-01-31 MED ORDER — METRONIDAZOLE 0.75 % TOPICAL GEL
1.0000 | Freq: Two times a day (BID) | CUTANEOUS | Status: DC
Start: 2022-01-31 — End: 2022-02-01
  Administered 2022-01-31 – 2022-02-01 (×2): 0 via TOPICAL

## 2022-01-31 MED ORDER — ACETAMINOPHEN 325 MG TABLET
975.0000 mg | ORAL_TABLET | Freq: Four times a day (QID) | ORAL | Status: AC
Start: 2022-01-31 — End: 2022-02-01
  Administered 2022-02-01: 975 mg via ORAL
  Filled 2022-01-31: qty 3

## 2022-01-31 MED ORDER — FENTANYL (PF) 50 MCG/ML INJECTION WRAPPER
50.0000 ug | INJECTION | INTRAMUSCULAR | Status: DC | PRN
Start: 2022-01-31 — End: 2022-01-31

## 2022-01-31 MED ORDER — ACETAMINOPHEN 325 MG TABLET
ORAL_TABLET | ORAL | Status: AC
Start: 2022-01-31 — End: 2022-01-31
  Filled 2022-01-31: qty 3

## 2022-01-31 MED ORDER — ETHYL ALCOHOL 62 % (NOZIN NASAL SANITIZER) NASAL SOLUTION - BULK BOTTLE
1.0000 | Freq: Two times a day (BID) | NASAL | Status: DC
Start: 2022-01-31 — End: 2022-02-01
  Administered 2022-01-31 – 2022-02-01 (×2): 1 via NASAL

## 2022-01-31 MED ORDER — ONDANSETRON HCL (PF) 4 MG/2 ML INJECTION SOLUTION
INTRAMUSCULAR | Status: AC
Start: 2022-01-31 — End: 2022-01-31
  Filled 2022-01-31: qty 4

## 2022-01-31 MED ORDER — IPRATROPIUM 0.5 MG-ALBUTEROL 3 MG (2.5 MG BASE)/3 ML NEBULIZATION SOLN
3.0000 mL | INHALATION_SOLUTION | Freq: Once | RESPIRATORY_TRACT | Status: DC | PRN
Start: 2022-01-31 — End: 2022-01-31

## 2022-01-31 MED ORDER — MULTIVITAMIN-MINERALS-LUTEIN TABLET
1.0000 | ORAL_TABLET | Freq: Every day | ORAL | Status: DC
Start: 2022-01-31 — End: 2022-01-31

## 2022-01-31 MED ORDER — MIDAZOLAM 5 MG/ML INJECTION WRAPPER
INTRAMUSCULAR | Status: AC
Start: 2022-01-31 — End: 2022-01-31
  Filled 2022-01-31: qty 1

## 2022-01-31 MED ORDER — EPHEDRINE SULFATE 50 MG/ML INTRAVENOUS SOLUTION
Freq: Once | INTRAVENOUS | Status: DC | PRN
Start: 2022-01-31 — End: 2022-01-31
  Administered 2022-01-31: 5 mg via INTRAVENOUS
  Administered 2022-01-31 (×4): 10 mg via INTRAVENOUS
  Administered 2022-01-31: 5 mg via INTRAVENOUS
  Administered 2022-01-31: 10 mg via INTRAVENOUS

## 2022-01-31 MED ORDER — SODIUM CHLORIDE 0.9 % INTRAVENOUS PIGGYBACK
2.0000 g | Freq: Once | INTRAVENOUS | Status: DC
Start: 2022-01-31 — End: 2022-01-31

## 2022-01-31 MED ORDER — DEXAMETHASONE SODIUM PHOSPHATE (PF) 10 MG/ML INJECTION SOLUTION
8.0000 mg | Freq: Two times a day (BID) | INTRAMUSCULAR | Status: AC
Start: 2022-01-31 — End: 2022-02-01
  Administered 2022-01-31 – 2022-02-01 (×2): 8 mg via INTRAVENOUS
  Filled 2022-01-31 (×2): qty 1

## 2022-01-31 MED ORDER — LIDOCAINE HCL 20 MG/ML (2 %) INJECTION SOLUTION
Freq: Once | INTRAMUSCULAR | Status: DC | PRN
Start: 2022-01-31 — End: 2022-01-31
  Administered 2022-01-31: 80 mg

## 2022-01-31 MED ORDER — ASPIRIN 81 MG TABLET,DELAYED RELEASE
81.0000 mg | DELAYED_RELEASE_TABLET | Freq: Once | ORAL | Status: DC
Start: 2022-01-31 — End: 2022-01-31

## 2022-01-31 MED ORDER — DEXAMETHASONE SODIUM PHOSPHATE (PF) 10 MG/ML INJECTION SOLUTION
10.0000 mg | Freq: Once | INTRAMUSCULAR | Status: AC
Start: 2022-01-31 — End: 2022-01-31
  Administered 2022-01-31: 10 mg via INTRAVENOUS

## 2022-01-31 MED ORDER — TRANEXAMIC ACID 1,000 MG/10 ML (100 MG/ML) INTRAVENOUS SOLUTION
INTRAVENOUS | Status: AC
Start: 2022-01-31 — End: 2022-01-31
  Filled 2022-01-31: qty 10

## 2022-01-31 MED ORDER — MIDAZOLAM 5 MG/ML INJECTION WRAPPER
1.0000 mg | Freq: Once | INTRAMUSCULAR | Status: DC | PRN
Start: 2022-01-31 — End: 2022-01-31
  Administered 2022-01-31: 1 mg via INTRAVENOUS

## 2022-01-31 MED ORDER — HYDROCHLOROTHIAZIDE 25 MG TABLET
12.5000 mg | ORAL_TABLET | Freq: Every day | ORAL | Status: DC
Start: 2022-01-31 — End: 2022-02-01
  Administered 2022-02-01: 12.5 mg via ORAL
  Filled 2022-01-31: qty 1
  Filled 2022-01-31: qty 0.5

## 2022-01-31 MED ORDER — TRANEXAMIC ACID 1,000 MG/10 ML (100 MG/ML) INTRAVENOUS SOLUTION
1000.0000 mg | Freq: Once | INTRAVENOUS | Status: DC
Start: 2022-01-31 — End: 2022-01-31
  Filled 2022-01-31: qty 10

## 2022-01-31 MED ORDER — CEFAZOLIN 1 GRAM SOLUTION FOR INJECTION
INTRAMUSCULAR | Status: AC
Start: 2022-01-31 — End: 2022-01-31
  Filled 2022-01-31: qty 20

## 2022-01-31 MED ORDER — VANCOMYCIN IV - PHARMACIST TO DOSE PER PROTOCOL
Freq: Once | Status: DC | PRN
Start: 2022-01-31 — End: 2022-01-31

## 2022-01-31 MED ORDER — SODIUM CHLORIDE 0.9 % INTRAVENOUS PIGGYBACK
INJECTION | INTRAVENOUS | Status: AC
Start: 2022-01-31 — End: 2022-01-31
  Filled 2022-01-31: qty 150

## 2022-01-31 MED ORDER — MORPHINE 2 MG/ML INJECTION WRAPPER
2.0000 mg | INJECTION | INTRAMUSCULAR | Status: DC | PRN
Start: 2022-01-31 — End: 2022-02-01

## 2022-01-31 MED ORDER — SODIUM CHLORIDE 0.9 % (FLUSH) INJECTION SYRINGE
3.0000 mL | INJECTION | Freq: Three times a day (TID) | INTRAMUSCULAR | Status: DC
Start: 2022-01-31 — End: 2022-02-01
  Administered 2022-01-31: 0 mL
  Administered 2022-01-31 – 2022-02-01 (×2): 3 mL

## 2022-01-31 MED ORDER — FENTANYL (PF) 50 MCG/ML INJECTION WRAPPER
25.0000 ug | INJECTION | INTRAMUSCULAR | Status: DC | PRN
Start: 2022-01-31 — End: 2022-01-31
  Administered 2022-01-31 (×2): 25 ug via INTRAVENOUS

## 2022-01-31 MED ORDER — HYDROMORPHONE 2 MG/ML INJECTION WRAPPER
0.5000 mg | INJECTION | INTRAMUSCULAR | Status: DC | PRN
Start: 2022-01-31 — End: 2022-02-01
  Administered 2022-01-31: 0.5 mg via INTRAVENOUS
  Filled 2022-01-31: qty 1

## 2022-01-31 MED ORDER — SODIUM CHLORIDE 0.9 % (FLUSH) INJECTION SYRINGE
3.0000 mL | INJECTION | INTRAMUSCULAR | Status: DC | PRN
Start: 2022-01-31 — End: 2022-02-01

## 2022-01-31 MED ORDER — FAMOTIDINE (PF) 20 MG/2 ML INTRAVENOUS SOLUTION
INTRAVENOUS | Status: AC
Start: 2022-01-31 — End: 2022-01-31
  Filled 2022-01-31: qty 2

## 2022-01-31 MED ORDER — ONDANSETRON HCL (PF) 4 MG/2 ML INJECTION SOLUTION
8.0000 mg | Freq: Once | INTRAMUSCULAR | Status: AC
Start: 2022-01-31 — End: 2022-01-31
  Administered 2022-01-31: 8 mg via INTRAVENOUS

## 2022-01-31 MED ORDER — ONDANSETRON HCL (PF) 4 MG/2 ML INJECTION SOLUTION
4.0000 mg | Freq: Once | INTRAMUSCULAR | Status: DC | PRN
Start: 2022-01-31 — End: 2022-01-31

## 2022-01-31 MED ORDER — FERROUS SULFATE 324 MG (65 MG IRON) TABLET,DELAYED RELEASE
324.0000 mg | DELAYED_RELEASE_TABLET | Freq: Two times a day (BID) | ORAL | Status: DC
Start: 2022-01-31 — End: 2022-02-01
  Administered 2022-01-31 – 2022-02-01 (×2): 324 mg via ORAL
  Filled 2022-01-31 (×3): qty 1

## 2022-01-31 MED ORDER — OXYCODONE ER 10 MG TABLET,CRUSH RESISTANT,EXTENDED RELEASE 12 HR
EXTENDED_RELEASE_ORAL_TABLET | ORAL | Status: AC
Start: 2022-01-31 — End: 2022-01-31
  Filled 2022-01-31: qty 1

## 2022-01-31 MED ORDER — APREPITANT 40 MG CAPSULE
ORAL_CAPSULE | ORAL | Status: AC
Start: 2022-01-31 — End: 2022-01-31
  Filled 2022-01-31: qty 1

## 2022-01-31 MED ORDER — DOCUSATE SODIUM 100 MG CAPSULE
100.0000 mg | ORAL_CAPSULE | Freq: Two times a day (BID) | ORAL | Status: DC
Start: 2022-01-31 — End: 2022-02-01
  Administered 2022-01-31 – 2022-02-01 (×2): 100 mg via ORAL
  Filled 2022-01-31 (×3): qty 1

## 2022-01-31 MED ORDER — PROCHLORPERAZINE EDISYLATE 10 MG/2 ML (5 MG/ML) INJECTION SOLUTION
5.0000 mg | Freq: Once | INTRAMUSCULAR | Status: DC | PRN
Start: 2022-01-31 — End: 2022-01-31

## 2022-01-31 MED ORDER — FAMOTIDINE (PF) 20 MG/2 ML INTRAVENOUS SOLUTION
20.0000 mg | Freq: Once | INTRAVENOUS | Status: AC
Start: 2022-01-31 — End: 2022-01-31
  Administered 2022-01-31: 20 mg via INTRAVENOUS

## 2022-01-31 MED ORDER — FAMOTIDINE 20 MG TABLET
20.0000 mg | ORAL_TABLET | Freq: Two times a day (BID) | ORAL | Status: DC
Start: 2022-01-31 — End: 2022-02-01
  Administered 2022-01-31 – 2022-02-01 (×2): 20 mg via ORAL
  Filled 2022-01-31 (×2): qty 1

## 2022-01-31 MED ORDER — GABAPENTIN 300 MG CAPSULE
300.0000 mg | ORAL_CAPSULE | Freq: Once | ORAL | Status: AC
Start: 2022-01-31 — End: 2022-01-31
  Administered 2022-01-31: 300 mg via ORAL

## 2022-01-31 MED ORDER — SUGAMMADEX 100 MG/ML INTRAVENOUS SOLUTION
Freq: Once | INTRAVENOUS | Status: DC | PRN
Start: 2022-01-31 — End: 2022-01-31
  Administered 2022-01-31 (×5): 50 mg via INTRAVENOUS

## 2022-01-31 MED ORDER — TRANEXAMIC ACID 1,000 MG/10 ML (100 MG/ML) INTRAVENOUS SOLUTION
1000.0000 mg | Freq: Once | INTRAVENOUS | Status: DC
Start: 2022-01-31 — End: 2022-01-31

## 2022-01-31 MED ORDER — ACETAMINOPHEN 325 MG TABLET
975.0000 mg | ORAL_TABLET | Freq: Once | ORAL | Status: AC
Start: 2022-01-31 — End: 2022-01-31
  Administered 2022-01-31: 975 mg via ORAL

## 2022-01-31 MED ORDER — ONDANSETRON HCL (PF) 4 MG/2 ML INJECTION SOLUTION
4.0000 mg | INTRAMUSCULAR | Status: DC | PRN
Start: 2022-01-31 — End: 2022-02-01
  Administered 2022-01-31 – 2022-02-01 (×2): 4 mg via INTRAVENOUS
  Filled 2022-01-31 (×2): qty 2

## 2022-01-31 MED ORDER — MULTIVITAMIN-FERROUS FUMARATE-FOLIC ACID 18 MG-400 MCG TABLET
1.0000 | ORAL_TABLET | Freq: Every day | ORAL | Status: DC
Start: 2022-01-31 — End: 2022-02-01
  Administered 2022-02-01: 1 via ORAL
  Filled 2022-01-31 (×2): qty 1

## 2022-01-31 MED ORDER — CELECOXIB 100 MG CAPSULE
ORAL_CAPSULE | ORAL | Status: AC
Start: 2022-01-31 — End: 2022-01-31
  Filled 2022-01-31: qty 2

## 2022-01-31 MED ORDER — APIXABAN 5 MG TABLET
5.0000 mg | ORAL_TABLET | Freq: Two times a day (BID) | ORAL | Status: DC
Start: 2022-01-31 — End: 2022-01-31

## 2022-01-31 MED ORDER — OXYCODONE 5 MG TABLET
5.0000 mg | ORAL_TABLET | ORAL | Status: DC | PRN
Start: 2022-01-31 — End: 2022-02-01
  Administered 2022-02-01: 5 mg via ORAL
  Filled 2022-01-31: qty 1

## 2022-01-31 SURGICAL SUPPLY — 93 items
ADH SKNCLS 2-OCTYL CYNCRLT DRMBND PRINEO LIQUID 22CM (SUTURE/WOUND CLOSURE) ×2 IMPLANT
AID POSITION UNIV HEAD RESTRAINT BEACH CHR DISP (MED SURG SUPPLIES) ×1 IMPLANT
BAG BIOHAZ RD 30X24IN THK3 MIL C8-10GL LLDPE INFCT WASTE CAN (MED SURG SUPPLIES) ×1
BAG BIOHAZ RD 30X24IN THK3 MIL C8-10GL LLDPE INFCT WASTE CAN PNCT RST (MED SURG SUPPLIES) ×1
BAG BIOHAZ RD 43X30IN THK3 MIL C20-30GL LLDPE INFCT WASTE (MED SURG SUPPLIES) ×1
BAG BIOHAZ RD 43X30IN THK3 MIL C20-30GL LLDPE INFCT WASTE CAN PNCT RST (MED SURG SUPPLIES) ×1 IMPLANT
BLADE 10 2 END CBNSTL SURG STRL DISP (CUTTING ELEMENTS) ×3
BLADE 10 2 END CBNSTL SURG STRL DISP (SURGICAL CUTTING SUPPLIES) ×3 IMPLANT
BLADE 15 2 END CBNSTL SURG STRL DISP (CUTTING ELEMENTS) ×1
BLADE 15 2 END CBNSTL SURG STRL DISP (SURGICAL CUTTING SUPPLIES) ×1 IMPLANT
BLADE SAW 73X25MM 2 CUT SGTL SS THK.89MM MED W STRL LF (CUTTING ELEMENTS) ×1
BLADE SAW 73X25MM 2 CUT SGTL SS THK.89MM MED W STRL LF (SURGICAL CUTTING SUPPLIES) ×1 IMPLANT
BONE CEM MXVC3 HI VACU SPAT TU_BE UNIV SYSTEM STRL LF GRY (ORTHOPEDICS (NOT IMPLANTS)) ×1
CEMENT BONE SIMPLEX RADOPQ FD STRL (CEMENT) ×1 IMPLANT
CLEANER INSTR PREPZYME MUL-TRD CONTAINR NARSL NEUT PH BDGR (MISCELLANEOUS PT CARE ITEMS) ×1
CONV USE 102436 - NEEDLE HYPO  22GA 1.5IN STD MONOJECT SS POLYPROP REG BVL LL HUB UL SHRP ANTICORE BLU STRL LF  DISP (MED SURG SUPPLIES) ×1 IMPLANT
CONV USE ITEM 14518 - SUTURE 5 KV-37 TICRON 30IN BLU BRD 5 STRN COAT NONAB (SUTURE/WOUND CLOSURE) ×1 IMPLANT
CONV USE ITEM 321837 - GLOVE SURG 7.5 LTX PF NONST CRM (GLOVES AND ACCESSORIES) ×2 IMPLANT
CONV USE ITEM 321852 - GLOVE SURG 6.5 LF  PLISPRN (GLOVES AND ACCESSORIES) ×1 IMPLANT
CONV USE ITEM 321983 - GLOVE SURG 8 LTX CHEMO PF SMOOTH BEAD CUF STRL WHT 11.6IN PLMR THK.2MM THK.21MM (GLOVES AND ACCESSORIES) ×1 IMPLANT
CONV USE ITEM 322852 - ROLL ECODRI-SAFE ABS POLY BCK ORTHO (ORTHOPEDICS (NOT IMPLANTS)) ×2 IMPLANT
CONV USE ITEM 323185 - PAD EG 15SQ IN UNIV FOAM SPLT NONCORD ADULT 9100 SER (SURGICAL CUTTING SUPPLIES) ×1 IMPLANT
CONV USE ITEM 329146 - CLEANER INSTR PREPZYME MUL-TRD CONTAINR NARSL NEUT PH BDGR 22OZ (MISCELLANEOUS PT CARE ITEMS) ×1 IMPLANT
CONV USE ITEM 338662 - PACK SURG ASCP STRL DISP ~~LOC~~ BPT MED CNTR LF (CUSTOM TRAYS & PACK) ×1 IMPLANT
CONV USE ITEM 34153 - ELECTRODE ESURG BLADE PNCL 3/32IN STRL SS CAUT PSHBTN STD SHAFT LF  VEGA SER (SURGICAL CUTTING SUPPLIES) ×1 IMPLANT
CONV USE ITEM 81225 - BAG BIOHAZ RD 30X24IN THK3 MIL C8-10GL LLDPE INFCT WASTE CAN PNCT RST (MED SURG SUPPLIES) ×1 IMPLANT
COUNTER 20 CNT BLOCK ADH NEEDLE STRL LF  RD SHARP FOAM 15.75X11.5X14IN DISP (MED SURG SUPPLIES) ×1 IMPLANT
COUNTER 20 CNT BLOCK ADH NEEDLE STRL LF RD SHARP FOAM 15.75 (MED SURG SUPPLIES) ×1
COVER 53X24IN MAYOSTAND PRXM STRL DISP EQP SMS LF (DRAPE/PACKS/SHEETS/OR TOWEL) ×1 IMPLANT
COVER TBL 90X50IN STD SMS REINF FNFLD STRL LF  DISP (DRAPE/PACKS/SHEETS/OR TOWEL) ×2 IMPLANT
COVER TBL 90X50IN STD SMS REINF FNFLD STRL LF DISP (DRAPE/PACKS/SHEETS/OR TOWEL) ×2
DEVICE SUCT 2 FILTER CHAMBER SCKR STRL LF  DISP (MED SURG SUPPLIES) ×2 IMPLANT
DISC USE ITEM 306517 - GLOVE SURG 8.5 LF PF SMOOTH BEAD CUF STRL GRN 12IN (GLOVES AND ACCESSORIES) ×1 IMPLANT
DRAPE 2 INCS FILM ANTIMIC 23X2_3IN IOBN STRL SURG (DRAPE/PACKS/SHEETS/OR TOWEL) ×1
DRAPE INCS ANTIMIC 23X23IN IOBN2 TRNSPR (DRAPE/PACKS/SHEETS/OR TOWEL) ×1 IMPLANT
DRAPE MAYOSTAND CVR 53X24IN PR_XM LF STRL DISP EQP SMS (DRAPE/PACKS/SHEETS/OR TOWEL) ×1
DURAPREP 26ML 8630 CS/20 (MED SURG SUPPLIES) ×1
ELECTRODE ESURG BLADE PNCL 3/32IN STRL SS CAUT PSHBTN STD (CUTTING ELEMENTS) ×1
GLOVE SURG 6.5 LF  PF BEAD CUF SMOOTH TXTR STRL GRN 12IN SENSICARE PLISPRN SYN PLMR ALOE THK7.9 MIL (GLOVES AND ACCESSORIES) ×2 IMPLANT
GLOVE SURG 6.5 LF PF BEAD CUF SMOOTH TXTR STRL GRN 12IN (GLOVES AND ACCESSORIES) ×2
GLOVE SURG 6.5 LF PF SMOOTH STRL WHT PLISPRN (GLOVES AND ACCESSORIES) ×1
GLOVE SURG 6.5 LTX PF BEAD CUF MICRO ROUGHEN N-PYRG STRL STRW BGL SRG CURVE FINGER (GLOVES AND ACCESSORIES) ×2 IMPLANT
GLOVE SURG 6.5 LTX PF BEAD CUF_MICRO RGH N-PYRG STRL STRW (GLOVES AND ACCESSORIES) ×2
GLOVE SURG 7 LF  PF SMOOTH TXTR BEAD CUF STRL GRN 12IN SENSICARE PI PLISPRN SYN PLMR ALOE THK7.9 MIL (GLOVES AND ACCESSORIES) ×1 IMPLANT
GLOVE SURG 7 LF PF SMOOTH TXTR BEAD CUF STRL GRN 12IN (GLOVES AND ACCESSORIES) ×1
GLOVE SURG 7.5 LF PF SMOOTH STRL WHT PLISPRN (GLOVES AND ACCESSORIES) ×1
GLOVE SURG 7.5 LTX PF SMOOTH STRL CRM (GLOVES AND ACCESSORIES) ×2
GLOVE SURG 8 LF  BEAD CUF DERMASHIELD PLISPRN (GLOVES AND ACCESSORIES) ×1 IMPLANT
GLOVE SURG 8 LF PF SMOOTH STRL WHT PLISPRN (GLOVES AND ACCESSORIES) ×1
GLOVE SURG 8 LTX PF SMOOTH STRL CRM (GLOVES AND ACCESSORIES) ×1
GLOVE SURG 8.5 LF PF SMOOTH BEAD CUF STRL GRN 12IN (GLOVES AND ACCESSORIES) ×1
GLOVE SURG LF  PF STRL 7.5 PLISPRN DISP (GLOVES AND ACCESSORIES) ×1 IMPLANT
GOWN SURG XL XLNG L4 REINF HKLP CLSR SET IN SLEEVE STRL LF (DRAPE/PACKS/SHEETS/OR TOWEL) ×1
GOWN SURG XL XLNG L4 REINF HKLP CLSR SET IN SLEEVE STRL LF  DISP BLU SIRUS SMS PE 56IN (DRAPE/PACKS/SHEETS/OR TOWEL) ×1 IMPLANT
GUIDE AND BONE MODEL 59016201 (MISCELLANEOUS PT CARE ITEMS) ×1
HANDPC PLS LAV INTPLS SUCT FAN SPRAY TIP (MED SURG SUPPLIES) ×1
HDPE THK22 UM C40-45 GL L48 IN X W40 IN NATURAL (MISCELLANEOUS PT CARE ITEMS) ×2 IMPLANT
HEAD REUNION 40MM 17MM SHLDR HUM STD STRL (IMPLANTS SHOULDER) ×1 IMPLANT
LABEL MED CORRECT MED LABELING SYS 4 FLG 2 SHEET 24 PRPRNT (MED SURG SUPPLIES) ×1
LABEL MED CORRECT MED LABELING SYS 4 FLG 2 SHEET 24 PRPRNT STRL (MED SURG SUPPLIES) ×1 IMPLANT
MIXER BONE CEM MXVC3 STRL LF DISP (ORTHOPEDICS (NOT IMPLANTS)) ×1 IMPLANT
MODEL ANTM GUIDE BONE T/S ARTHPL SYS (MISCELLANEOUS PT CARE ITEMS) ×1 IMPLANT
NEEDLE HYPO 22GA 1.5IN STD MONOJECT SS POLYPROP REG BVL LL (MED SURG SUPPLIES) ×1
NEEDLE SUT 3 .5 CRC TAPER PNT MAYO CATGUT STRL LF (SUTURE/WOUND CLOSURE) ×2 IMPLANT
PACK ARTHROGRAM MDLN INDUSTRIES INC. RAD GEN N/M S DISP (CUSTOM TRAYS & PACK) ×1
PACK SURG ASCP STRL DISP ~~LOC~~ BPT MED CNTR LF (CUSTOM TRAYS & PACK) ×1
PAD EG 15SQ IN UNIV FOAM SPLT NONCORD ADULT 9100 SER (CUTTING ELEMENTS) ×1
REUNION TSA SELF PRESSURIZING GLENOID ×1 IMPLANT
ROLL ECODRI-SAFE ABS POLY BCK ORTHO (ORTHOPEDICS (NOT IMPLANTS)) ×2
RSTRNT HD UNIV LF DISP_10/CS (MED SURG SUPPLIES) ×1
SET INTPLS SUCT TUBE FAN SPRAY TIP HANDPC STRL LF  DISP (MED SURG SUPPLIES) ×1 IMPLANT
SLING ORTHO LRG 17.5X8.5IN ARM SHLDR PAD ENV BRTHBL HKLP (ORTHOPEDICS (NOT IMPLANTS)) ×2 IMPLANT
SOL IRRG 0.9% NACL 2000ML PRSV FR N-PYRG FLXB CONTAINR STRL (MEDICATIONS/SOLUTIONS) ×1
SOL IRRG 0.9% NACL 2000ML PRSV FR N-PYRG FLXB CONTAINR STRL LF (MEDICATIONS/SOLUTIONS) ×1 IMPLANT
SOL SURG PREP 26ML DRPRP 74% ISPRP 0.7% IOD POVACRYLEX SLF CNTN APPL SKIN STRL PREOP (MED SURG SUPPLIES) ×1 IMPLANT
SPONGE LAP 18X18IN PREWASH RIGID TRY STRL LF  WHT (MED SURG SUPPLIES) ×2 IMPLANT
SPONGE LAP 18X18IN PREWASH RIGID TRY STRL LF WHT (MED SURG SUPPLIES) ×2
STEM HUM REUNION 123MM PRFT SHLDR 11MM STRL T/S ARTHPL (IMPLANTS SHOULDER) ×1 IMPLANT
SUTURE 1 GS-24 POLYSRB 36IN VIOL BRD COAT ABS (SUTURE/WOUND CLOSURE) ×2 IMPLANT
SUTURE 2-0 GS-21 POLYSRB 30IN UNDYED BRD COAT ABS (SUTURE/WOUND CLOSURE) ×2 IMPLANT
SUTURE 2-0 GS-21 POLYSRB_30IN BRD COAT ABS (SUTURE/WOUND CLOSURE) ×2
SUTURE 4-0 C-13 BIOSYN 30IN UNDYED MONOF ABS (SUTURE/WOUND CLOSURE) ×2 IMPLANT
SUTURE 4-0 C-14 SURGIPRO2 18IN BLU MONOF NONAB (SUTURE/WOUND CLOSURE) ×2 IMPLANT
SUTURE 5 CCS-1 FIBERWIRE 38IN BLU BRD TIE MS LWR KNT PROF NONAB (SUTURE/WOUND CLOSURE) ×2 IMPLANT
SUTURE 5 CCS-1 FIBERWIRE 38IN_BLU MS LWR KNT PRFL NONAB (SUTURE/WOUND CLOSURE) ×2
SUTURE 5 KV-37 TICRON 30IN BLU BRD 5 STRN COAT NONAB (SUTURE/WOUND CLOSURE) ×1
SUTURE SILK 4-0 CV-23 SOFSILK 75CM BLK BRD NONAB (SUTURE/WOUND CLOSURE) ×1 IMPLANT
SUTURE SILK 4-0 CV-23 SOFSILK SURGALLOY 30IN BLK BRD COAT (SUTURE/WOUND CLOSURE) ×1
SYRINGE BD LL 1.5IN 18GA 10ML LF  STRL BLUNT FIL NEEDLE CONCEN TIP DEHP-FR MED POLYPROP REG WL DISP (MED SURG SUPPLIES) ×1 IMPLANT
SYRINGE BD LL 1.5IN 18GA 10ML_LF STRL BLUNT FIL NEEDLE (MED SURG SUPPLIES) ×1
WIRE FIX REUNION NITINOL PILT STRL LF (IMPLANTS TRAUMA) ×2 IMPLANT
WOUND IRRG IRRISEPT DBRD CLNSG 0.05% CHG SYSTEM STRL LF (WOUND CARE SUPPLY) ×1 IMPLANT
WOUND IRRG IRRISEPT DBRD CLNSG_0.05% CHG SYSTEM STRL LF (WOUND CARE/ENTEROSTOMAL SUPPLY) ×1

## 2022-01-31 NOTE — OR Surgeon (Signed)
[facility]  Operative Note     PATIENT NAME:  Kathy Morrison, Kathy Morrison  MRN:  [id number]  DOB:  Dec 14, 1945    Date of Procedure:  01/31/2022  Preoperative Diagnosis: OSTEOARTHRITIS LEFT SHOULDER   Postoperative Diagnoses:  OSTEOARTHRITIS LEFT SHOULDER   Procedure Performed: Procedure(s) (LRB):  LEFT TOTAL SHOULDER ARTHROPLASTY USING TRU SITE IMPLANTS (Left)   Implants:  Implant Name Type Inv. Item Serial No. Manufacturer Lot No. LRB No. Used Action   CEMENT BONE SIMPLEX RADOPQ FD_STRL - [id number] CEMENT CEMENT BONE SIMPLEX RADOPQ FD_STRL  [employer]  Left 1 Implanted   REUNION TSA SELF PRESSURIZING GLENOID    STRYKER [id number] Left 1 Implanted   STEM HUM REUNION 123MM PRFT SH_LDR 11MM STRL - [id number]  STEM HUM REUNION 123MM PRFT SH_LDR 11MM STRL  [employer] [id number] Left 1 Implanted   HEAD REUNION 40MM 17MM SHLDR HUM STD STRL - [id number]  HEAD REUNION 40MM 17MM SHLDR HUM STD STRL  [employer] [id number] Left 1 Implanted      Surgeon: Karsten Ro, MD   Anesthesia: Anesthesiologist: Yevette Edwards, MD  CRNA: Sharen Hones, CRNA; Almyra Free, CRNA   OR Staff: Circulator: Lollie Marrow, RN  PERIOPERATIVE CARE ASSISTANT: Avel Peace, CST  Relief Scrub: Gwynneth Albright, ST  Scrub Person: Estrella Deeds, [place]  Scrub First Assist: Vladimir Crofts, ST  Scrub Retractor Assist: Vic Ripper, ST  Anesthesia Tech: Jeri Modena, AA   Anticoagulation: Eliquis    Antibiotics:    Estimated Blood Loss: * No blood loss amount entered *   Specimens: * No specimens in log *   Complications: None immediate  Indications For Procedure:  Kathy Morrison  is a very pleasant  76 y.o. female  presenting  for [place] total shoulder replacement was discussed and contrasted with nonsurgical options and reverse total shoulder replacement.  We discussed specific risks of dislocation, injury to the axillary and expected function following replacement.  We discussed the general risks  of anesthetic complications and infection.  Patient felt that the current level of symptoms here.  There is a daily living justify proceeding with the procedure.  Surgical informed consent was obtained.   Intraoperative Findings: Severe arthritis right shoulder with large inferior head spur and glenoid erosions  Description of Procedure: Site and side verified antibiotics administered.  Anesthesia induced patient positioned in modified barber chair position under the supervision of anesthesia with head holder.     Deltopectoral approach completed with incision slightly lateral and taking the vein medially.  Conjoined tendon elevated and the subscapularis clearly I delineated.  Subscapularis was stay sutured with #5 Ethibond and then carefully removed from the lesser tuberosity leaving a small soft tissue for reattachment.  Incision extended proximally into the interval to the base of the coracoid.  Dissection continued anteriorly along the proximal humerus taking off a small portion of the superior pectoral.     Head was subluxed from the glenohumeral joint and then a hole drilled over the center of the humeral shaft.  The shaft was reamed by hand.  Once satisfactory chatter was obtained, the intramedullary cutting guide was used to perform proximal humeral resection at the level of the superior border of the teres.  Inferior spur removed.  Appropriate size broach was then used keeping the broach in approximately 25 degrees of retroversion.     Proximal humeral protector was applied and then the glenoid was exposed with a posterior glenoid retractor.  Some labrum dissected in the  anterior glenoid retractor placed.     The base of the coracoid was cleaned up a bit anteriorly and posterior to allow placement of the patient specific guide.  2 K wires were placed and then the guide removed and the wire placement compared with the 3D model.  Once appropriate position was verified the superior K wire was removed and then  the preselected reaming size used to ream the glenoid to the appropriate depth determined by the preop plan.     The second wire was placed and both wires were overdrilled.  The multihole drill guide for glenoid was seated and then the remaining 2 inferior holes drilled.  Glenoid trial was placed and showed excellent bony contact and seating.     Trial reduction was performed applying the trial head to the broach which was left in place.  Soft tissue tension revealed luxation of the head about 40 to 50% posteriorly and no significant downward tach.     , Head removed and then the glenoid exposed trial component removed and glenoid prepared for cement.  Trial reduction with the final component was performed to ensure adequate insertion.  Cement was placed into the holes in the dose stage and a small amount placed on the back of the prosthesis.  Prosthesis was then impacted with excellent seating.  Excess cement was removed from the edges of the glenoid component, and the component held in place until cement hardened.     Stem broach removed from the proximal humerus.  Irrisept irrigation performed.  Final components seated in predetermined retroversion and driven down three quarters of the way.  Head component was then placed into the [facility] taper and the stem and the head and stem driven down to its final seating place.  Fit was excellent.     Shoulder stability was again assessed and found to be satisfactory.  The subscapularis was meticulously repaired and the rotator interval closed.  Shoulder was tested for stability and mobility and found to be excellent.     Wound was thoroughly irrigated and then the deltopectoral interval closed with a running suture subcutaneous tissue closed with interrupted sutures and skin closed with running subcuticular stitch.  Occlusive dressings applied.  Patient placed in a sling taken recovery under the supervision of anesthesia    Karsten Ro, MD  01/31/2022, 15:28   This note  was partially generated using MModal Fluency Direct system, and there may be some incorrect words, spellings, and punctuation that were not noted in checking the note before saving.

## 2022-01-31 NOTE — Nurses Notes (Signed)
Spoke with Dr. Nicole Cella at this time regarding '5mg'$  Eliquis scheduled for 2100. He stated to go ahead and give it.

## 2022-01-31 NOTE — Nurses Notes (Signed)
Patient with complaints of feeling queasy. Medicated for nausea. See MAR. Will continue to monitor patient. Patient is due to void.

## 2022-01-31 NOTE — OR Nursing (Signed)
PATIENT IDENTIFIED? YES  DRUG ALLERGIES VERIFIED? YES  CONSENT AND SIDE VERIFIED? PER PT & chart   PICTURES TAKEN? NO   IRRISEPT SOLUTION USED INTRAOP

## 2022-01-31 NOTE — Anesthesia Preprocedure Evaluation (Signed)
ANESTHESIA PRE-OP EVALUATION  Planned Procedure: LEFT TOTAL SHOULDER ARTHROPLASTY USING TRU SITE IMPLANTS (Left: Shoulder)  Review of Systems    PONV       patient summary reviewed  nursing notes reviewed        Pulmonary  negative pulmonary ROS,  past history of smoking ,   Cardiovascular    Hypertension, well controlled, CAD, ECG reviewed, atrial fibrillation (paroxysmal; s/p ablation) and hyperlipidemia ,No peripheral edema,  Exercise Tolerance: > or = 4 METS        GI/Hepatic/Renal    GERD and well controlled        Endo/Other    osteoarthritis,      Neuro/Psych/MS    anxiety     Cancer  CA (skin),                     Physical Assessment      Airway       Mallampati: III    TM distance: <3 FB    Neck ROM: full  Mouth Opening: good.            Dental           (+) caps, missing             Pulmonary    Breath sounds clear to auscultation  (-) no rhonchi, no decreased breath sounds, no wheezes, no rales and no stridor     Cardiovascular    Rhythm: regular  Rate: Normal  (-) no friction rub, carotid bruit is not present, no peripheral edema and no murmur     Other findings            Plan  ASA 3     Planned anesthesia type: general     general anesthesia with endotracheal tube intubation    plan to administer opioids postoperatively            PONV/POV Plan:  I plan to administer pharmcologic prophalaxis antiemetics  Intravenous induction     Anesthesia issues/risks discussed are: Dental Injuries, Stroke, Nerve Injuries, Intraoperative Awareness/ Recall, Eye /Visual Loss, Aspiration, PONV, Blood Loss, Difficult Airway, Sore Throat and Cardiac Events/MI.  Anesthetic plan and risks discussed with patient  Signed consent obtained        Use of blood products discussed with patient who consented to blood products.     Patient's NPO status is appropriate for Anesthesia.           Plan discussed with CRNA.    (Medical clearance by Idell Pickles MD)

## 2022-01-31 NOTE — Anesthesia Postprocedure Evaluation (Signed)
Anesthesia Post Op Evaluation    Patient: Kathy Morrison  Procedure(s):  LEFT TOTAL SHOULDER ARTHROPLASTY USING TRU SITE IMPLANTS    Last Vitals:Temperature: 36.2 C (97.1 F) (01/31/22 1630)  Heart Rate: 70 (01/31/22 1644)  BP (Non-Invasive): 138/84 (01/31/22 1644)  Respiratory Rate: 18 (01/31/22 1644)  SpO2: 99 % (01/31/22 1644)    No notable events documented.    Patient is sufficiently recovered from the effects of anesthesia to participate in the evaluation and has returned to their pre-procedure level.  Patient location during evaluation: PACU       Patient participation: complete - patient participated  Level of consciousness: awake and alert and responsive to verbal stimuli    Pain score: 1  Pain management: adequate  Airway patency: patent    Anesthetic complications: no  Cardiovascular status: acceptable  Respiratory status: acceptable  Hydration status: acceptable  Patient post-procedure temperature: Pt Normothermic   PONV Status: Absent

## 2022-01-31 NOTE — Anesthesia Transfer of Care (Signed)
ANESTHESIA TRANSFER OF CARE   Kathy Morrison is a 76 y.o. ,female, Weight: 74.8 kg (165 lb)   had Procedure(s):  LEFT TOTAL SHOULDER ARTHROPLASTY USING TRU SITE IMPLANTS  performed  01/31/22   Primary Service: Karsten Ro, MD    Past Medical History:   Diagnosis Date   . Atrial fibrillation (CMS HCC)    . Bilateral cataracts    . Bilateral hearing loss    . Bradycardia    . CAD (coronary artery disease)    . Cancer of skin, squamous cell     breast   . Edema of lower extremity    . HLD (hyperlipidemia)    . HTN (hypertension)    . Kidney lesion    . Knee pain    . Mitral valve disorder    . Osteopenia    . Paresthesia of upper limb    . Pelvic mass    . Vitamin D deficiency       Allergy History as of 01/31/22     CODEINE       Noted Status Severity Type Reaction    01/31/22 0934 Neysa Bonito, RN 03/04/17 Active Medium Topical Rash    03/04/17 0928 Kathleen Lime 03/04/17 Active             SIMVASTATIN       Noted Status Severity Type Reaction    01/31/22 0935 Neysa Bonito, RN 03/04/17 Active Medium Topical Rash    03/04/17 0928 Kathleen Lime 03/04/17 Active             CEFDINIR       Noted Status Severity Type Reaction    01/31/22 0934 Neysa Bonito, RN 01/31/22 Active Medium  Rash    01/31/22 0908 Duffy, Lattie Haw, CPhT 01/31/22 Active                 I completed my transfer of care / handoff to the receiving personnel during which we discussed:  Access, Airway, All key/critical aspects of case discussed, Analgesia, Antibiotics, Expectation of post procedure, Fluids/Product, Gave opportunity for questions and acknowledgement of understanding, Labs and PMHx    Post Location: PACU                                                                  Last OR Temp: Temperature: 36.3 C (97.4 F)  ABG:  POTASSIUM   Date Value Ref Range Status   01/09/2022 3.5 3.5 - 5.1 mmol/L Final     KETONES   Date Value Ref Range Status   01/09/2022 Negative Negative, Trace mg/dL Final     CALCIUM   Date Value Ref Range Status    01/09/2022 9.5 8.6 - 10.3 mg/dL Final     Airway:* No LDAs found *  Blood pressure (!) 140/65, pulse 81, temperature 36.3 C (97.4 F), resp. rate (!) 21, height 1.651 m ('5\' 5"'$ ), weight 74.8 kg (165 lb), SpO2 95 %.

## 2022-01-31 NOTE — Nurses Notes (Signed)
Patient arrived to floor post left total shoulder replacement. Patient moved over on hover mat. She is drowsy upon arrival but responds to voice. She is able to answer yes or no questions and states she is in pain. Patient's son, daughter and brother at bedside to help answer some admission questions. Family numbers written on board. Patient on 2 liters oxygen, surgical site is clean and intact.

## 2022-02-01 LAB — CBC WITH DIFF
BASOPHIL #: 0 10*3/uL (ref 0.00–2.50)
BASOPHIL %: 0 % (ref 0–3)
EOSINOPHIL #: 0 10*3/uL (ref 0.00–2.40)
EOSINOPHIL %: 0 % (ref 0–7)
HCT: 40 % (ref 37.0–47.0)
HGB: 13.5 g/dL (ref 12.5–16.0)
LYMPHOCYTE #: 0.6 10*3/uL — ABNORMAL LOW (ref 2.10–11.00)
LYMPHOCYTE %: 7 % — ABNORMAL LOW (ref 25–45)
MCH: 29 pg (ref 27.0–32.0)
MCHC: 33.7 g/dL (ref 32.0–36.0)
MCV: 86 fL (ref 78.0–99.0)
MONOCYTE #: 0.2 10*3/uL (ref 0.00–4.10)
MONOCYTE %: 3 % (ref 0–12)
MPV: 8.2 fL (ref 7.4–10.4)
NEUTROPHIL #: 8.6 10*3/uL (ref 4.10–29.00)
NEUTROPHIL %: 91 % — ABNORMAL HIGH (ref 40–76)
PLATELET COMMENT: NORMAL
PLATELETS: 226 10*3/uL (ref 140–440)
RBC: 4.66 10*6/uL (ref 4.20–5.40)
RDW: 13.9 % (ref 11.6–14.8)
WBC: 9.5 10*3/uL (ref 4.0–10.5)
WBCS UNCORRECTED: 9.5 10*3/uL

## 2022-02-01 LAB — BASIC METABOLIC PANEL
ANION GAP: 8 mmol/L — ABNORMAL LOW (ref 10–20)
BUN/CREA RATIO: 21 (ref 6–22)
BUN: 14 mg/dL (ref 7–25)
CALCIUM: 8.3 mg/dL — ABNORMAL LOW (ref 8.6–10.3)
CHLORIDE: 103 mmol/L (ref 98–107)
CO2 TOTAL: 27 mmol/L (ref 21–31)
CREATININE: 0.67 mg/dL (ref 0.60–1.30)
ESTIMATED GFR: 91 mL/min/{1.73_m2} (ref >59)
GLUCOSE: 175 mg/dL — ABNORMAL HIGH (ref 74–109)
OSMOLALITY, CALCULATED: 281 mOsm/kg (ref 270–290)
POTASSIUM: 3.4 mmol/L — ABNORMAL LOW (ref 3.5–5.1)
SODIUM: 138 mmol/L (ref 136–145)

## 2022-02-01 MED ORDER — ONDANSETRON 4 MG DISINTEGRATING TABLET
4.0000 mg | ORAL_TABLET | Freq: Three times a day (TID) | ORAL | 0 refills | Status: AC | PRN
Start: 2022-02-01 — End: ?

## 2022-02-01 MED ORDER — OXYCODONE 5 MG TABLET
5.0000 mg | ORAL_TABLET | Freq: Four times a day (QID) | ORAL | 0 refills | Status: AC | PRN
Start: 2022-02-01 — End: ?

## 2022-02-01 NOTE — Nurses Notes (Signed)
Patient's DC instructions reviewed with her and her daughter,questions answered. IV removed and patient taken out to vehicle in wheelchair.

## 2022-02-01 NOTE — Care Management Notes (Signed)
[place] [facility] Management Initial Evaluation    Patient Name: Kathy Morrison  Date of Birth: [birth year]  Sex: female  Date/Time of Admission: 01/31/2022  9:00 AM  Room/Bed: 372/A  Payor: MEDICARE / Plan: MEDICARE PART A AND B / Product Type: Medicare /   Primary Care Providers:  Heath Lark, DO (General)    Pharmacy Info:   Preferred Pharmacy       [place] [address]    Phone: [phone] Fax: [phone]    Hours: Not open 24 hours          Emergency Contact Info:   Extended Emergency Contact Information  Primary Emergency Contact: [place] [place] Phone: [phone]  Relation: Relative    History:   Meira Wahba is a 76 y.o., female, admitted 01/31/22.    Height/Weight: 165.1 cm ('5\' 5"' ) / 74.8 kg (165 lb)     LOS: 1 day   Admitting Diagnosis: Status post total knee replacement [Z96.659]    Assessment:      02/01/22 1849   Assessment Details   Assessment Type Admission   Date of Care Management Update 02/01/22   Insurance Information/Type   Insurance type Medicare   Employment/Financial   Patient has Prescription Coverage?  Yes        Name of Insurance Coverage for Medications Medicare   Financial Concerns none   Living Environment   Select an age group to open "lives with" row.  Adult   Lives With alone   Living Arrangements house   Able to Return to Prior Arrangements yes   IEP and/or 504 Plan? No   Home Safety   Home Assessment: No Problems Identified   Custody and Legal Status   Do you have a court appointed guardian/conservator? No   Are you an emancipated minor? No   Custody Issues? No   Paternity Affidavit Requested? No   Care Management Plan   Discharge Planning Status initial meeting   Projected Discharge Date 02/01/22   Discharge plan discussed with: Patient   CM will evaluate for rehabilitation potential no   Patient choice offered to patient/family no   Form for  patient choice reviewed/signed and on chart no   Patient aware of possible cost for ambulance transport?  No   Discharge Needs Assessment   Equipment Currently Used at Home none   Equipment Needed After Discharge none   Discharge Facility/Level of Care Needs Home (Patient/Family Member/other)(code 1)   Transportation Available car;family or friend will provide   Referral Information   Admission Type inpatient   Arrived From home or self-care     CM met with pt at bedside. Pt reported that she lives at home alone and will return there upon discharge. Pt reports that her daughter and her friend will assist in her care. Pt denies any need for equipment or home health services.    Discharge Plan:  Home (Patient/Family Member/other) (code 1)      The patient will continue to be evaluated for developing discharge needs.     Case Manager: Doroteo Bradford, [place]  Phone: [phone]

## 2022-02-01 NOTE — Discharge Summary (Signed)
ORTHOPAEDIC SURGERY DISCHARGE SUMMARY    PATIENT NAME:  Kathy Morrison  MRN:  [id number]  DOB:  1946-01-14    ADMISSION DATE:  01/31/2022  DISCHARGE DATE:  02/01/2022     ATTENDING PHYSICIAN: Murvin Donning, DO  PRIMARY CARE PHYSICIAN: Adriana Mccallum, DO      ADMISSION DIAGNOSIS:    Arhtritis left shoulder    DISCHARGE DIAGNOSIS:      Status Post left total shoulder replacement    DISCHARGE MEDICATIONS:       Current Discharge Medication List      START taking these medications.      Details   ondansetron 4 mg Tablet, Rapid Dissolve  Commonly known as: ZOFRAN ODT   4 mg, Oral, EVERY 8 HOURS PRN  Qty: 14 Tablet  Refills: 0     oxyCODONE 5 mg Tablet  Commonly known as: ROXICODONE   5 mg, Oral, EVERY 6 HOURS PRN  Qty: 30 Tablet  Refills: 0        CONTINUE these medications - NO CHANGES were made during your visit.      Details   apixaban 5 mg Tablet  Commonly known as: ELIQUIS   5 mg, Oral, 2 TIMES DAILY  Refills: 0     aspirin 81 mg Tablet, Delayed Release (E.C.)  Commonly known as: ECOTRIN   81 mg, Oral, ONCE  Refills: 0     biotin-keratin 10,000-100 mcg-mg Tablet   Oral, DAILY  Refills: 0     cholecalciferol (Vitamin D3) 125 mcg (5,000 unit) Tablet   Oral, DAILY  Refills: 0     CINNAMON ORAL   1,000 mg, Oral, DAILY  Refills: 0     coenzyme Q10 100 mg Capsule   100 mg, Oral, EVERY EVENING WITH DINNER  Refills: 0     hydroCHLOROthiazide 12.5 mg Capsule  Commonly known as: MICROZIDE   12.5 mg, Oral, DAILY  Refills: 0     metroNIDAZOLE 0.75 % Gel  Commonly known as: METROGEL   SEE COMMENT, metronidazole 0.75 % topical gel  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  May remove dressing at this time and leave incision open to air if dry.  If there is persistent drainage please cover with a gauze dressing and change daily. Please call the clinic with any erythema or purulent drainage concerning for infection.    May shower, no tub baths until follow-up     Take pain medication and blood thinners as prescribed. Attend physical therapy as instructed.    Follow-up in the orthopedics clinic at your scheduled appointment in approximately 2-3 weeks.  Please call the office to confirm your appointment.  Please also call with any questions or concerns.    REASON FOR HOSPITALIZATION AND HOSPITAL COURSE:  Kathy Morrison is a 76 y.o.female that I have been following as an outpatient secondary to left shoulder arthritis.   she went on to fail conservative treatment of their arthritic joint and elected to proceed with a total joint replacement.  she gained preop medical clearance.  We instructed him on the risk and benefits of the surgery, reviewed the consent in its entirety, we reviewed the hospital course including interoperative and postoperative goals.  she arrived on date of procedure.  her  interoperative procedure was uncomplicated.  Patient was admitted postoperatively and remained stable through postoperative course.  Placed on appropriate DVT prophylaxis. They progressed well with physical therapy and will be discharged to home.  Patient will need a walker for initial ambulation to assist with correcting gait form and function.  We have educated the patient on the discharge process,  reviewed med reconciliation, and once again reviewed postoperative goals.  Patient was educated on dressing instructions and care.  We have reminded the patient through education the importance of a healthy diet  during the perioperative period.  Questions were encouraged and answered.        RADIOLOGY:           CONSULTATIONS:      None     PROCEDURES PERFORMED:      Left Total shoulder Replacement    CONDITION ON DISCHARGE:     Stable    DISCHARGE DISPOSITION:      Home    FOLLOW UP VISITS:    - Dr. Marcelino Scot in 2-3 weeks at scheduled appointment; call [place] clinic to confirm or with questions/concerns.  - It is recommended you follow up with your PCP within 2 weeks to review any medical changes made during this hospitalization.    Rebeca Alert, D.O.  02/01/22 08:23  [place]  Please call with any questions/concerns    This note has been created with voice recognition software.  Please excuse any errors in transcription.  Occasional wrong word or sound alike substitutions may have occurred due to the inherent limitations of voice recognition software.  Please read the chart carefully and recognize using context with the substitutions may have occurred.

## 2022-02-01 NOTE — Discharge Instructions (Signed)
Orthopaedic Discharge Instructions:    Non Weightbearing to the operative extremity until follow up    Maintain sling, may remove to shower and perform range of motion of the elbow and wrist.    Maintain surgical dressing.  If there is persistent drainage please cover with a gauze dressing and change daily. Please call the clinic with any erythema or purulent drainage concerning for infection.    May shower, no tub baths until follow-up     Take pain medication and blood thinners as prescribed.     Follow-up in the orthopedics clinic at your scheduled appointment in approximately 2-3 weeks.  Please call the office to confirm your appointment.  Please also call with any questions or concerns.      [facility] [place], [place], [place] [address]  [phone]

## 2022-02-01 NOTE — Care Plan (Signed)
[facility]  [place], [address]  [phone]  [phone]  Rehabilitation Services  Physical Therapy Inpatient Initial Evaluation    Patient Name: Kathy Morrison  Date of Birth: November 19, 1945  Height: Height: 165.1 cm ('5\' 5"'$ )  Weight: Weight: 74.8 kg (165 lb)  Room/Bed: 372/A  Payor: MEDICARE / Plan: MEDICARE PART A AND B / Product Type: Medicare /       PMH:  Past Medical History:   Diagnosis Date    Atrial fibrillation (CMS HCC)     Bilateral cataracts     Bilateral hearing loss     Bradycardia     CAD (coronary artery disease)     Cancer of skin, squamous cell     breast    Edema of lower extremity     HLD (hyperlipidemia)     HTN (hypertension)     Kidney lesion     Knee pain     Mitral valve disorder     Osteopenia     Paresthesia of upper limb     Pelvic mass     Vitamin D deficiency            Assessment:      (P) Post-op Lt TSA; limited function LUE; pt demonstrating good understanding in use of sling, no AROM Lt shoulder; PROM Lt shoulder; AROM elbow distally; pendulum exercise.  Demonstrating safe amb for functional distances/instructions given for use of walking stick as needed.  Will have asssistance at discharge.  Ready for discharge per PT standpoint.  discussed with charge nurse.    Discharge Needs:    Equipment Recommendation: (P)  (cane or walking stick as needed)     Discharge Disposition: (P) home with assist    JUSTIFICATION OF DISCHARGE RECOMMENDATION   Based on current diagnosis, functional performance prior to admission, and current functional performance, this patient requires continued PT services in (P) home with assist in order to achieve significant functional improvements in these deficit areas:  .        Plan:   Current Intervention: (P)  (Pt being dischargedhome)  To provide physical therapy services    for duration of (P) evaluation only.    The risks/benefits of therapy have been discussed with the patient/caregiver and  he/she is in agreement with the established plan of care.       Subjective & Objective     Past Medical History:   Diagnosis Date    Atrial fibrillation (CMS HCC)     Bilateral cataracts     Bilateral hearing loss     Bradycardia     CAD (coronary artery disease)     Cancer of skin, squamous cell     breast    Edema of lower extremity     HLD (hyperlipidemia)     HTN (hypertension)     Kidney lesion     Knee pain     Mitral valve disorder     Osteopenia     Paresthesia of upper limb     Pelvic mass     Vitamin D deficiency             Past Surgical History:   Procedure Laterality Date    BREAST SURGERY      COLONOSCOPY      HX BREAST BIOPSY      HX HYSTERECTOMY      MOLE REMOVAL  02/01/22 0913   Rehab Session   Document Type evaluation   Total PT Minutes: 23   Patient Effort good   Symptoms Noted During/After Treatment nausea   General Information   Patient Profile Reviewed yes   Onset of Illness/Injury or Date of Surgery 01/31/22   Patient/Family/Caregiver Comments/Observations going home today   Pertinent History of Current Functional Problem Adm. following Lt TSA.   Medical Lines   (pulse oximeter)   Respiratory Status room air   Existing Precautions/Restrictions fall precautions  (sling to be worn, no AROM Lt shoulder)   General Observations of Patient cleared for PT per nursing, pt receptive, describes some nausea.  sitting in chair, sling in place/adjusted by PT   Weight-bearing Status   Extremity Weight-bearing Status left upper extremity   Left Upper Extremity non weight-bearing (NWB)   Mutuality/Individual Preferences   Anxieties, Fears or Concerns LUE healing   Individualized Care Needs instructions given for care to LUE   Patient-Specific Goals (Include Timeframe) Go home today, less pain, get good use of LUE   Plan of Care Reviewed With patient   Living Environment   Lives With alone   Home Assessment: No Problems Identified   Home Accessibility stairs to enter home  (no railing on  steps)   Transportation Available car   Living Environment Comment 1 step into den of home; has tub/shower combo, no shower chair; has walking stick   Home Main Entrance   Stair Railings, Main Entrance none   Number of Stairs, Main Entrance two   Functional Level Prior   Ambulation 0 - independent   Transferring 0 - independent   Toileting 0 - independent   Bathing 0 - independent   Dressing 0 - independent   Eating 0 - independent   Communication 0 - understands/communicates without difficulty   Self-Care   Dominant Hand right   Usual Activity Tolerance good   Current Activity Tolerance fair   Important Activities exercise;social   Regular Exercise yes   Activity/Exercise/Self-Care Comment 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   Pre Treatment Status   Pre Treatment Patient Status Patient sitting in bedside chair or w/c;Call light within reach;Nurse approved session   Support Present Pre Treatment  None   Communication Pre Treatment  Nurse   Communication Pre Treatment Comment cleared for PT   Cognitive Assessment/Interventions   Behavior/Mood Observations behavior appropriate to situation, WNL/WFL   Orientation Status oriented x 4   Attention WNL/WFL   Follows Commands WNL   Vital Signs   Pre-Treatment Heart Rate (beats/min) 80   Post-treatment Heart Rate (beats/min) 82   Pre SpO2 (%) 96   O2 Delivery Pre Treatment room air   Post SpO2 (%) 96   O2 Delivery Post Treatment room air   Pain Assessment   Additional Documentation Numbers Pain Scale, Pre/Post Treatment (Group)   Pretreatment Pain Rating 5/10   Posttreatment Pain Rating 6/10   Pain Location - Side Left   Pain Location shoulder   RUE Assessment   RUE Assessment WFL- Within Functional Limits   LUE Assessment   LUE Assessment X-Exceptions   LUE ROM WFL hand/forearm/elbow; shoulder to approx. 45 deg flexion PROM with pt using RUE while in sling   LUE Strength NA   LUE Other limitations post-op Lt shoulder   RLE Assessment   RLE Assessment WFL-  Within Functional Limits   LLE Assessment   LLE Assessment WFL- Within Functional Limits   Trunk Assessment  Trunk Assessment WFL-Within Functional Limits   Bed Mobility Assessment/Treatment   Comment NA   Transfer Assessment/Treatment   Transfer Comment sit to/from stand chair with correct Rt hand support on armrest for safety/assistance; no LOB   Gait Assessment/Treatment   Total Distance Ambulated 100   Independence  stand-by assistance   Distance in Feet 100   Comment No significant LOB, tolerating WB LEs, SBG given for safety due to immobilized LUE.  LImited distance due to nausea   Therapeutic Exercise/Activity   Comment Pt instructed in/performed pendulum exercise Lt shoulder standing, verbal cues for correction of technique/followed; pt performed PROM Lt shoulder flex using RUE while LUE in sling; AROM elbow distally performed.  Written instructions given/HEP.   Post Treatment Status   Post Treatment Patient Status Patient sitting in bedside chair or w/c;Call light within reach   Support Present Post Treatment    (friend present)   Secondary school teacher Treatment Comment regarding status with PT/ready for discharge   Plan of Care Review   Plan Of Care Reviewed With patient   Functional Impairment   Overall Functional Impairments/Problem List pain;ROM decreased;strength decreased   Physical Therapy Clinical Impression   Assessment Post-op Lt TSA; limited function LUE; pt demonstrating good understanding in use of sling, no AROM Lt shoulder; PROM Lt shoulder; AROM elbow distally; pendulum exercise.  Demonstrating safe amb for functional distances/instructions given for use of walking stick as needed.  Will have asssistance at discharge.  Ready for discharge per PT standpoint.  discussed with charge nurse.   Patient/Family Goals Statement ready to go home   Predicted Duration of Therapy Intervention (days/wks) evaluation only   Anticipated Equipment Needs at Discharge  (PT)   (cane or walking stick as needed)   Anticipated Discharge Disposition home with assist   Referral Needed to Another Service   (OP PT when ordered by physician)   Evaluation Complexity Justification   Patient History: Co-morbidity/factors that impact Plan of Care Surgical procedure: causing pain &/or impaired function   Examination Components Range of motion;Strength;Ambulation   Presentation Stable: Uncomplicated, straight-forward, problem focused   Clinical Decision Making Low complexity   Evaluation Complexity Low complexity   Planned Therapy Interventions, PT Eval   Planned Therapy Interventions (PT)   (Pt being dischargedhome)   (INSERT FLOWSHEET)    INTERVENTION MINUTES: EVALUATION 24 minutes including pt education in there ex for LUE, care of LUE    EVALUATION COMPLEXITY : CLINICAL DECISION MAKING OF LOW COMPLEXITY AS INDICATED BY PMH, PHYSICAL THERAPY ASSESSMENT OF MUSCULOSKELETAL AND NEUROLOGICAL SYSTEMS AND ACTIVITY LIMITATIONS. CLINICAL PRESENTATION IS STABLE AND UNCOMPLICATED    Therapist:     Vonna Kotyk, PT  02/01/2022, 12:18

## 2022-04-05 IMAGING — US DUPLEX VENOUS US UNILATERAL
1 series · 14 of 24 positions shown · non-contrast
Comparison: None available.

﻿EXAM:  DUPLEX VENOUS US UNILATERAL
INDICATION: Cough swelling.

[Series 1: duplex venous us unilateral · 14 of 63 slices shown]
[im 1/63]
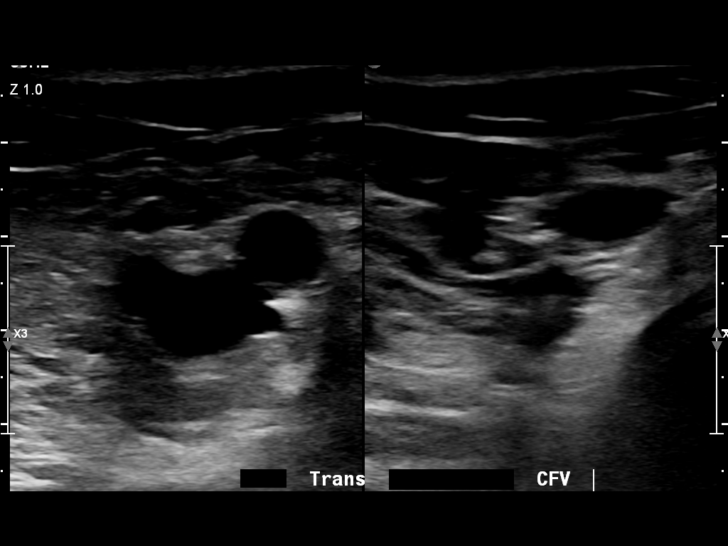
[im 6/63]
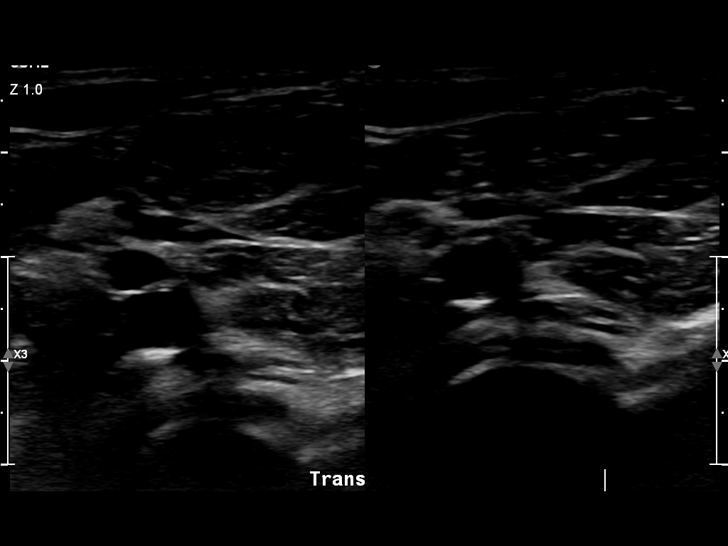
[im 11/63]
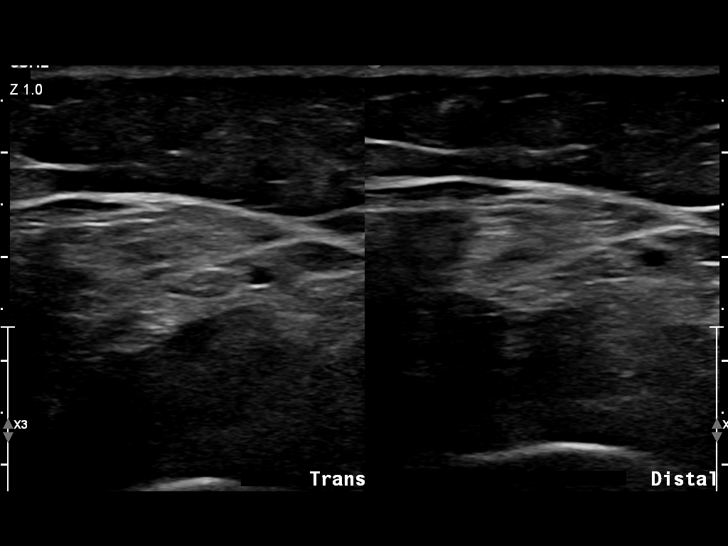
[im 17/63]
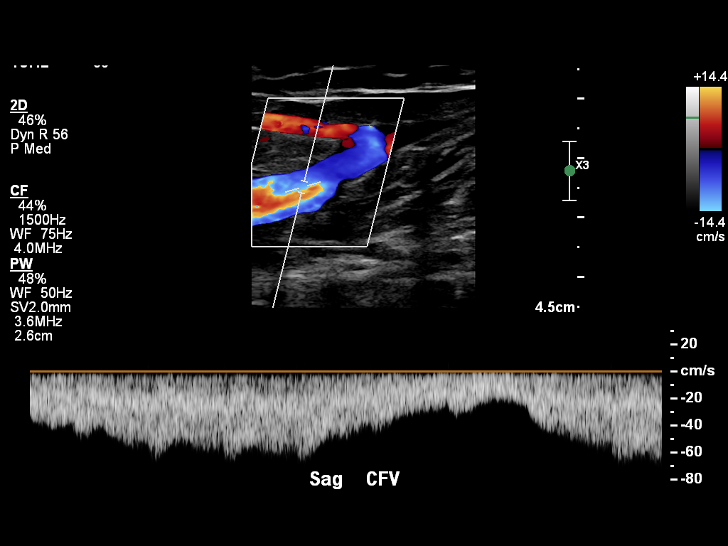
[im 19/63]
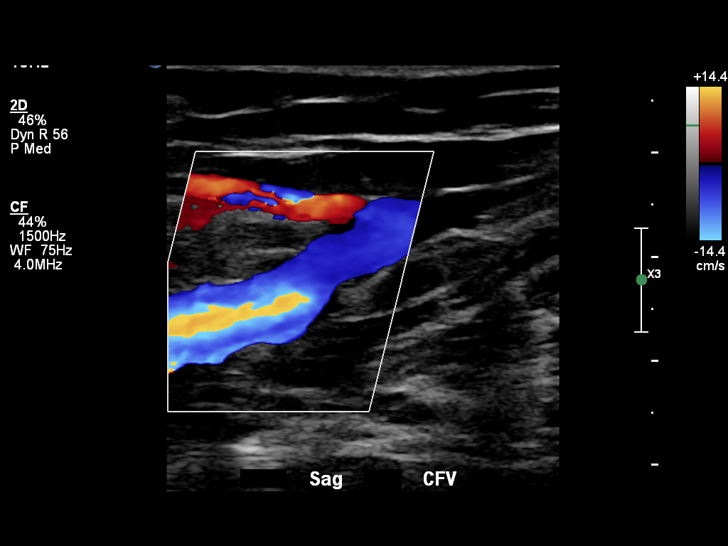
[im 25/63]
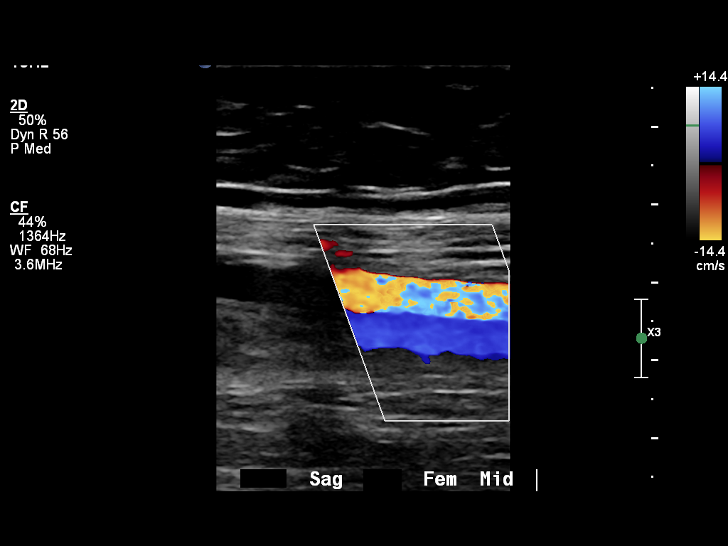
[im 30/63]
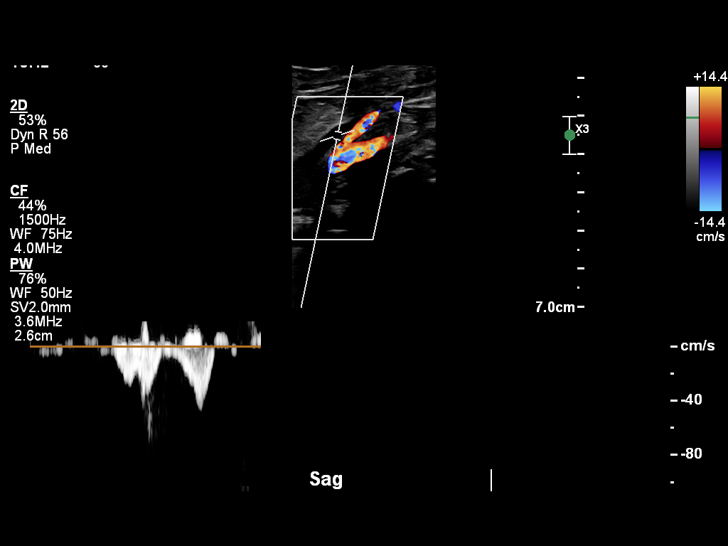
[im 33/63]
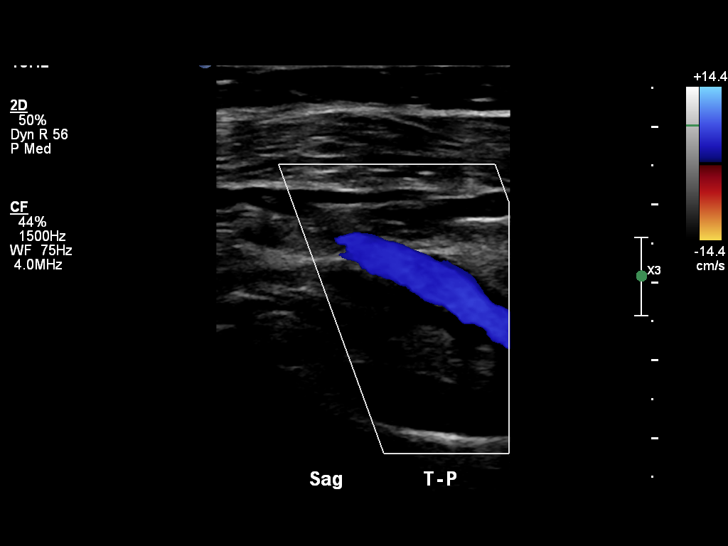
[im 38/63]
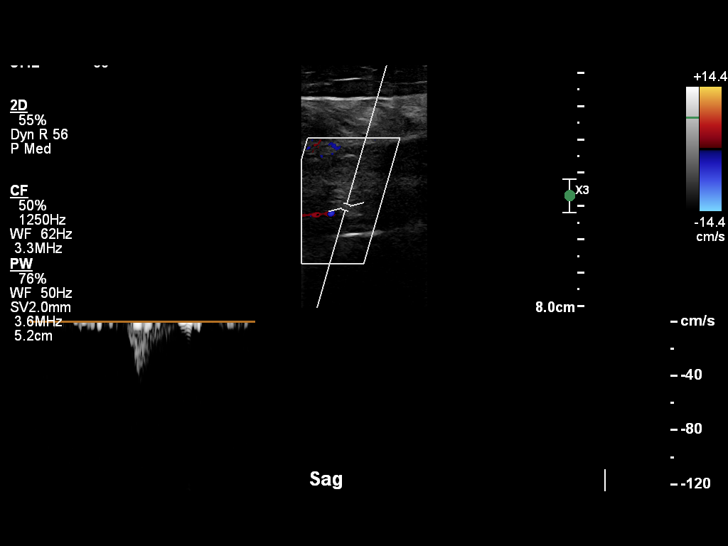
[im 44/63]
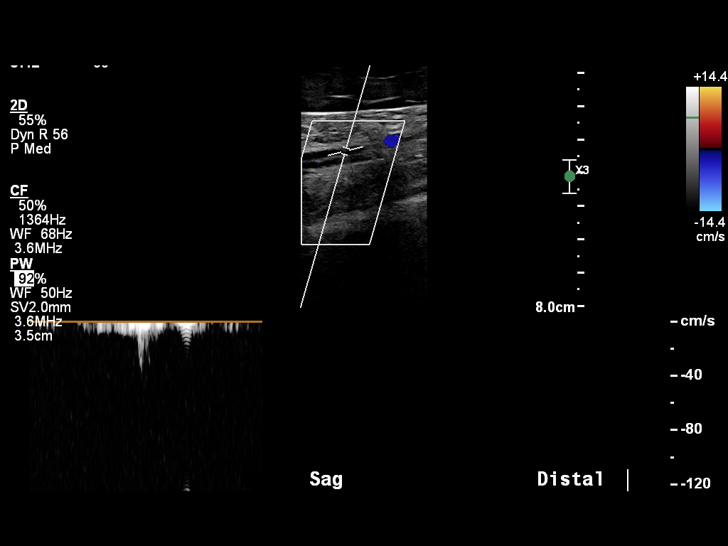
[im 49/63]
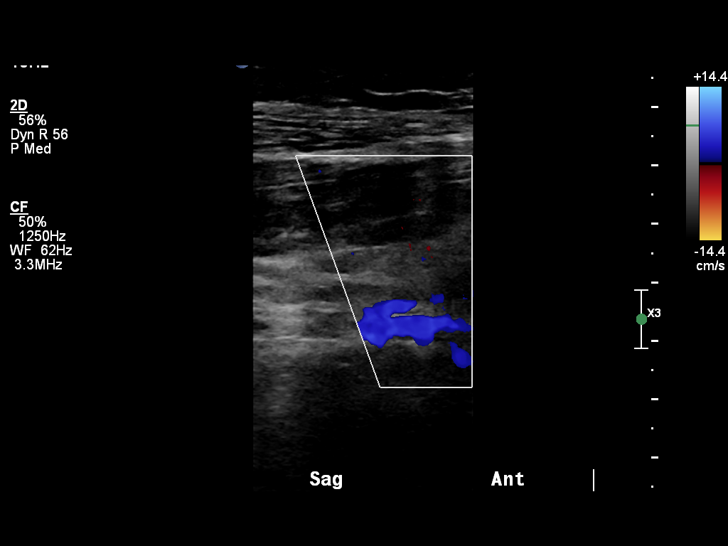
[im 52/63]
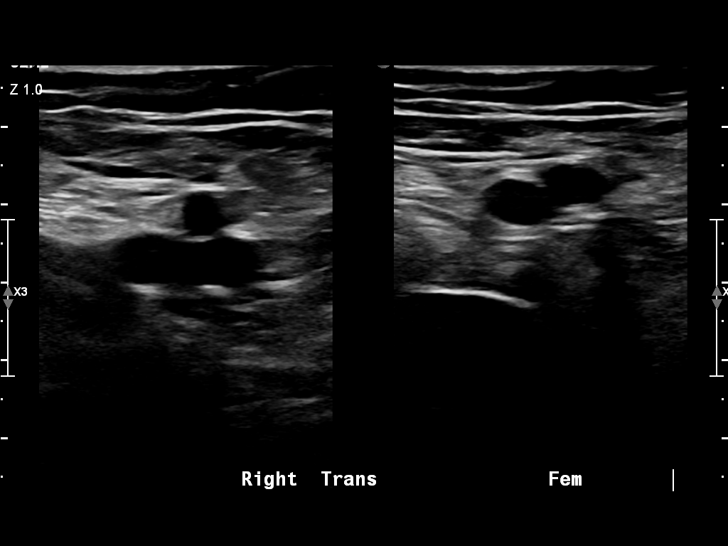
[im 57/63]
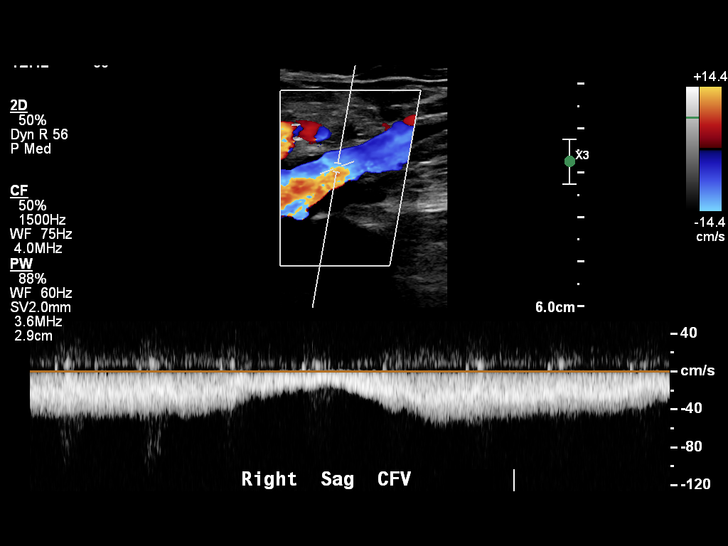
[im 63/63]
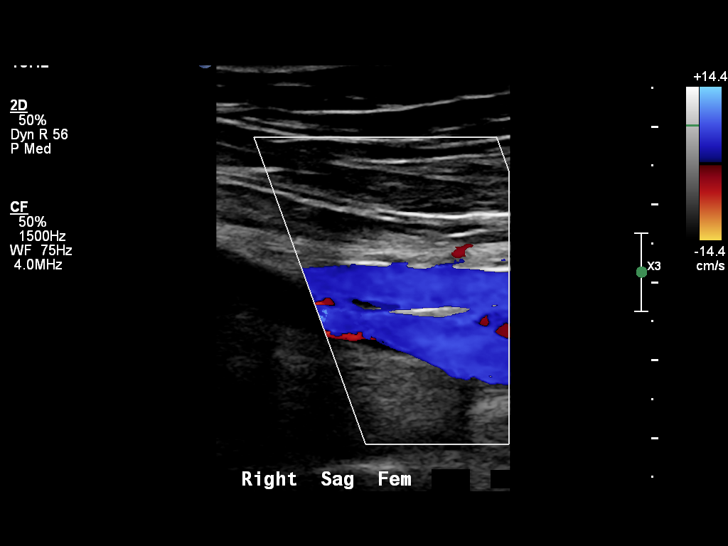

[14 of 24 positions shown; findings below may reference images not displayed]

FINDINGS: Sonographic evaluation of the left lower extremity was performed utilizing grayscale, color flow and pulsed Doppler techniques. 

The common femoral, superficial femoral and popliteal veins demonstrate normal compressibility, respiratory phasicity and augmentation. Spectral Doppler analysis is also unremarkable. The calf veins are also patent. The contralateral common femoral vein also demonstrates normal compressibility.
IMPRESSION: No evidence of left lower extremity deep venous thrombus.

## 2023-04-23 IMAGING — MR MRI KNEE LT W/O CONTRAST
5 series · 40 of 40 positions shown · IV contrast (gadolinium)
Comparison: Left knee x-ray dated 03/05/2023.

﻿EXAM:  [address]   MRI KNEE LT W/O CONTRAST
INDICATION: 77-year-old with severe lateral left knee pain with positive McMurray test.  Injured the knee going up stairs.  Patient [place] Lmelda.  No history of previous knee surgery.
TECHNIQUE: Multiplanar, multisequential MRI of the left knee was performed without gadolinium contrast.

[Series 5: PD fat-sat · axial · left · 4.0mm · 0.53mm/px · z∈[-72,+59]mm · 8 of 30 slices shown (1 of 3)]
[im 1/30]
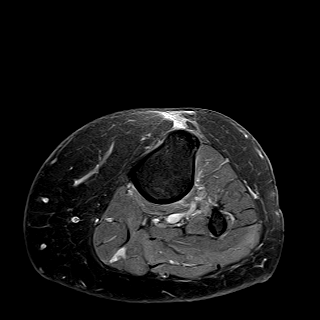
[im 5/30]
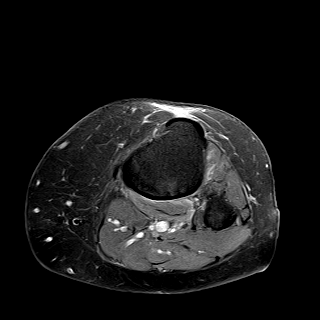
[im 9/30]
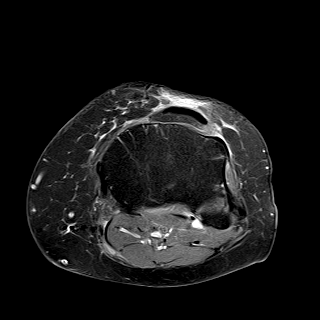
[im 13/30]
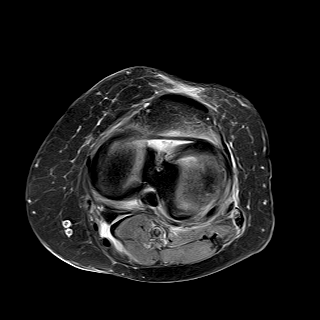
[im 17/30]
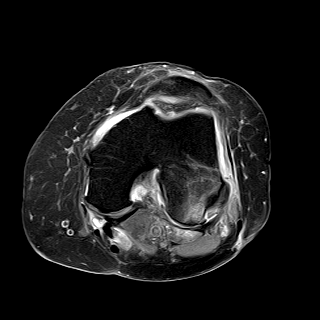
[im 21/30]
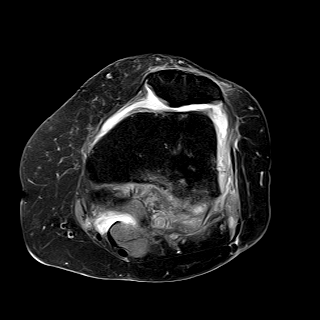
[im 25/30]
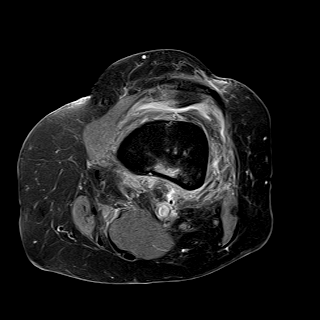
[im 30/30]
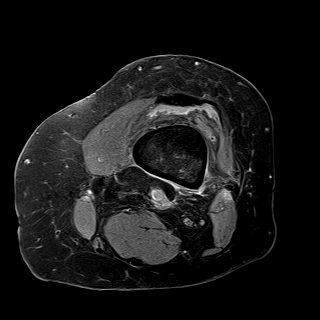

[Series 6: PD fat-sat · sagittal · left · 3.0mm · 0.47mm/px · 9 of 30 slices shown (2 of 3)]
[im 1/30]
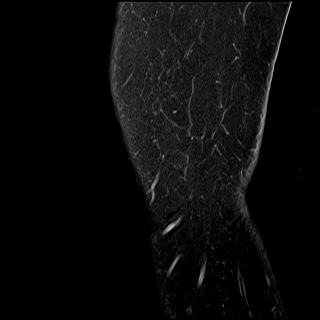
[im 4/30]
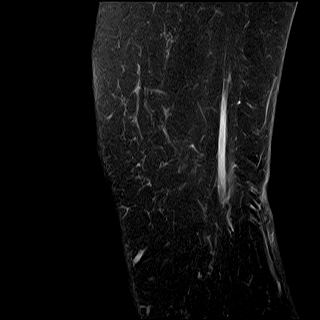
[im 8/30]
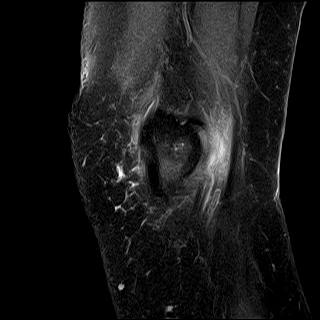
[im 11/30]
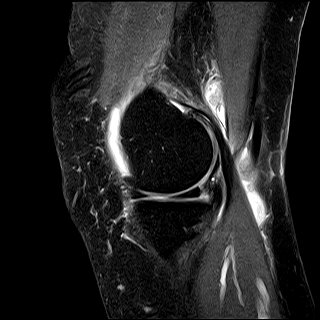
[im 15/30]
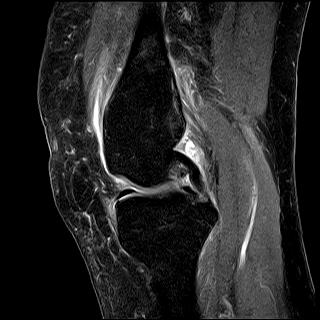
[im 19/30]
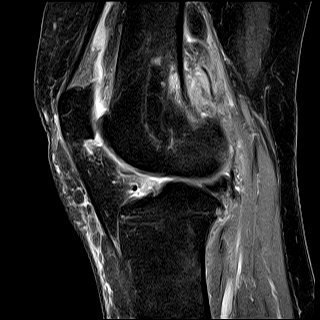
[im 22/30]
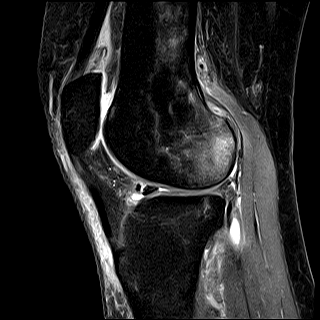
[im 26/30]
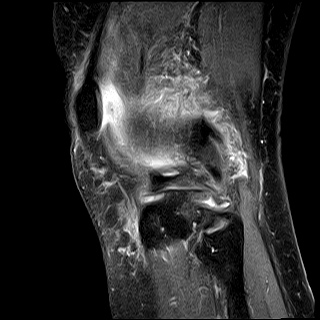
[im 30/30]
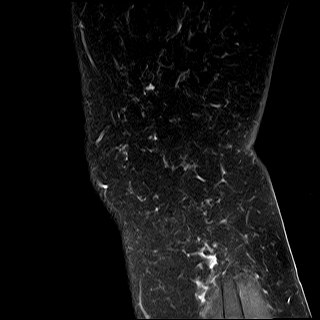

[Series 7: T1 · sagittal · left · 3.0mm · 0.39mm/px · 9 of 30 slices shown]
[im 1/30]
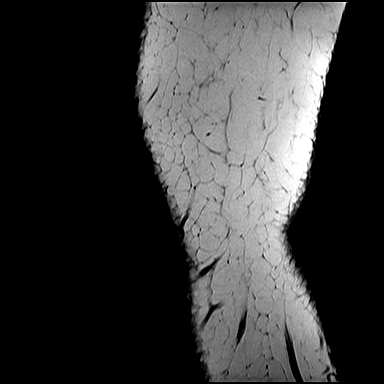
[im 4/30]
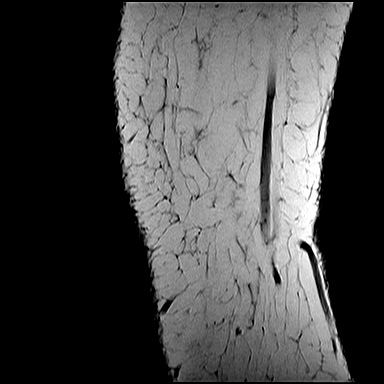
[im 8/30]
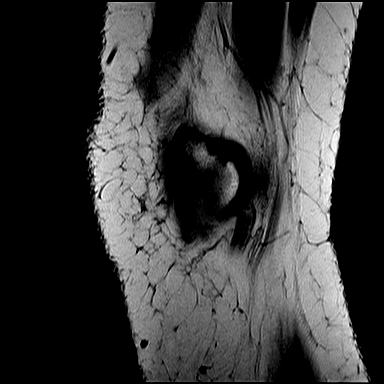
[im 11/30]
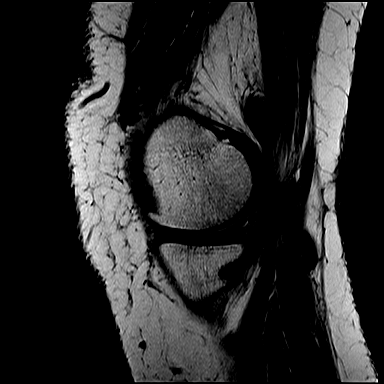
[im 15/30]
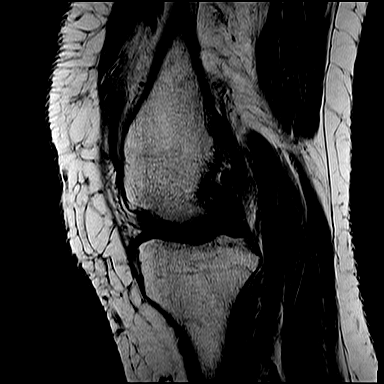
[im 19/30]
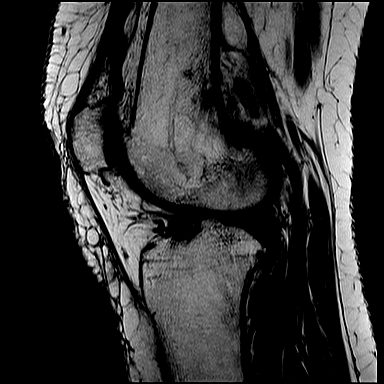
[im 22/30]
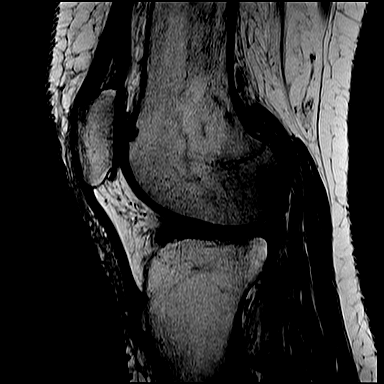
[im 26/30]
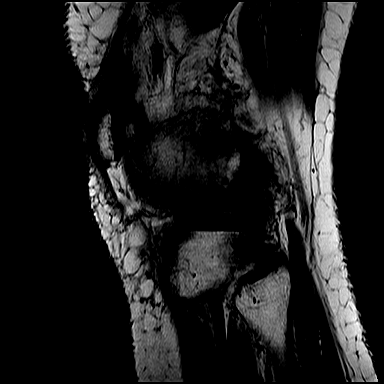
[im 30/30]
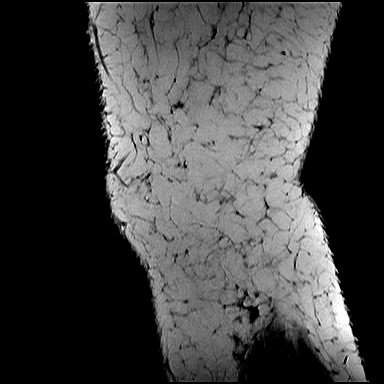

[Series 8: STIR · coronal · left · 3.5mm · 0.47mm/px · 7 of 23 slices shown]
[im 1/23]
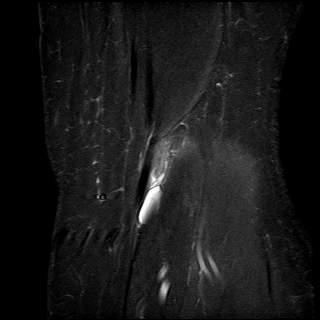
[im 4/23]
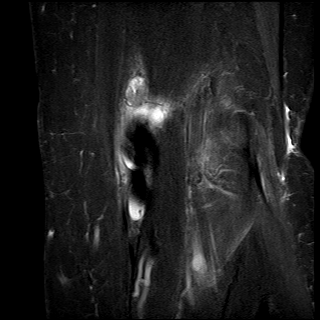
[im 8/23]
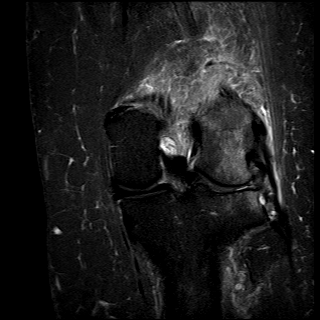
[im 12/23]
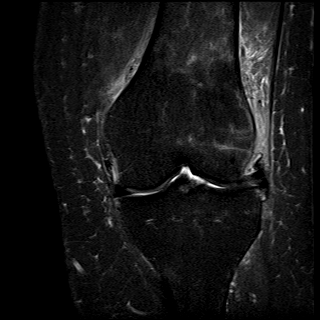
[im 15/23]
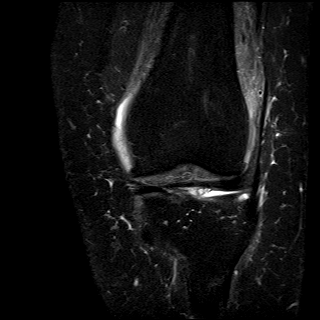
[im 19/23]
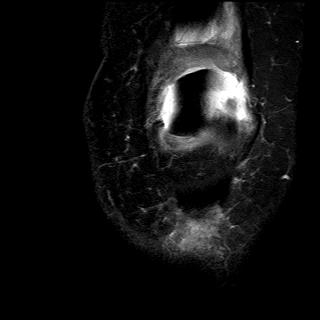
[im 23/23]
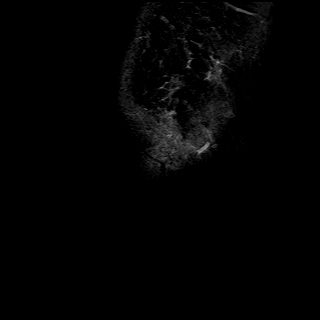

[Series 10: PD fat-sat · coronal · left · 3.5mm · 0.47mm/px · 7 of 23 slices shown (3 of 3)]
[im 1/23]
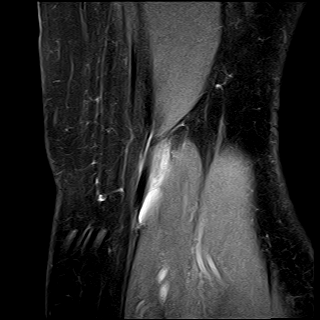
[im 4/23]
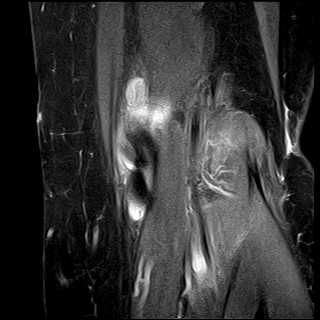
[im 8/23]
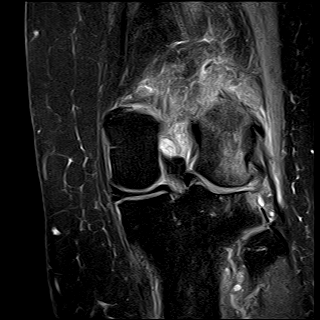
[im 12/23]
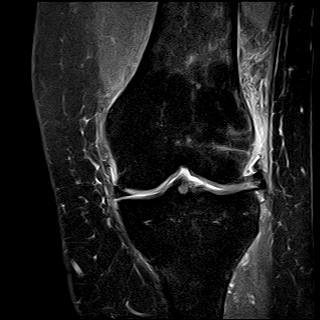
[im 15/23]
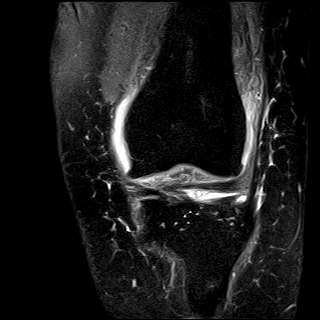
[im 19/23]
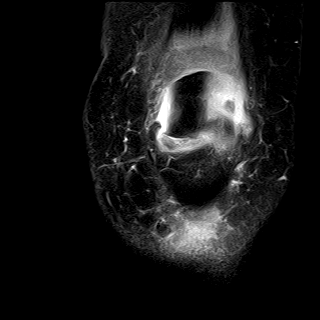
[im 23/23]
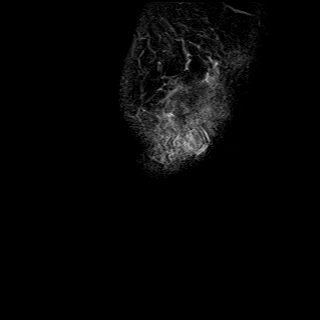

[40 of 40 positions shown; findings below may reference images not displayed]

FINDINGS: Bone marrow edema on both sides of lateral compartment is noted, involving the lateral femoral condyle posteriorly more than the lateral tibial condyle.  Grade 3 to grade 4 degenerative changes of the articular cartilages over the lateral tibial plateau are noted.

The lateral meniscus shows severe abnormality with large portion of posterior lateral meniscus absent, suggestive of chronic complex tear and severe degenerative changes of lateral meniscus.

Anterior and posterior cruciate ligaments are intact.  Medial meniscus shows no acute findings. Mild degenerative changes of medial articular cartilage.  Collateral ligaments are intact. 

Quadriceps tendon and patellar tendon are intact.  Moderate degenerative changes of lateral facet of the patellofemoral articulation.  Effusion in the knee joint is noted with small Baker's cyst.

Bruising and edema of the lateral head of the gastrocnemius origin is noted in the posterior aspect of the knee.
IMPRESSION: 1. Significant deformity and abnormality of posterior horn of the lateral meniscus with a large portion of lateral meniscus absent. Findings are suggestive of significant chronic complex tear and degenerative changes of the lateral meniscus.

2. Significant loss of articular cartilage over the lateral tibial plateau.  Bone marrow edema on both sides of the lateral compartment, involving more the lateral femoral condyle than the tibial condyle.

3. Effusion in the knee joint with small Baker's cyst.  Bruising and edema of the lateral head of the gastrocnemius are noted in the posterior lateral aspect of the knee.
